# Patient Record
Sex: Female | Born: 1960 | Race: Black or African American | Hispanic: No | Marital: Married | Smoking: Never smoker
Health system: Southern US, Community
[De-identification: ages and names within clinical notes are randomized; demographics above are authoritative.]

## PROBLEM LIST (undated history)

## (undated) DIAGNOSIS — D649 Anemia, unspecified: Secondary | ICD-10-CM

## (undated) DIAGNOSIS — E079 Disorder of thyroid, unspecified: Secondary | ICD-10-CM

## (undated) DIAGNOSIS — I10 Essential (primary) hypertension: Secondary | ICD-10-CM

## (undated) HISTORY — DX: Disorder of thyroid, unspecified: E07.9

## (undated) HISTORY — PX: OTHER SURGICAL HISTORY: SHX169

## (undated) HISTORY — PX: NO PAST SURGERIES: SHX2092

---

## 2011-05-08 ENCOUNTER — Inpatient Hospital Stay (HOSPITAL_COMMUNITY)
Admission: EM | Admit: 2011-05-08 | Discharge: 2011-05-11 | DRG: 395 | Disposition: A | Discharge status: Home / Self Care | Payer: BC Managed Care – PPO | Attending: Internal Medicine | Admitting: Internal Medicine

## 2011-05-08 ENCOUNTER — Encounter: Payer: Self-pay | Admitting: Emergency Medicine

## 2011-05-08 DIAGNOSIS — D649 Anemia, unspecified: Secondary | ICD-10-CM

## 2011-05-08 DIAGNOSIS — D6489 Other specified anemias: Principal | ICD-10-CM | POA: Diagnosis present

## 2011-05-08 DIAGNOSIS — D219 Benign neoplasm of connective and other soft tissue, unspecified: Secondary | ICD-10-CM | POA: Diagnosis present

## 2011-05-08 DIAGNOSIS — R0609 Other forms of dyspnea: Secondary | ICD-10-CM | POA: Diagnosis present

## 2011-05-08 DIAGNOSIS — R059 Cough, unspecified: Secondary | ICD-10-CM

## 2011-05-08 DIAGNOSIS — R05 Cough: Secondary | ICD-10-CM

## 2011-05-08 DIAGNOSIS — R609 Edema, unspecified: Secondary | ICD-10-CM | POA: Diagnosis present

## 2011-05-08 DIAGNOSIS — D259 Leiomyoma of uterus, unspecified: Secondary | ICD-10-CM | POA: Diagnosis present

## 2011-05-08 DIAGNOSIS — N920 Excessive and frequent menstruation with regular cycle: Secondary | ICD-10-CM

## 2011-05-08 DIAGNOSIS — R0989 Other specified symptoms and signs involving the circulatory and respiratory systems: Secondary | ICD-10-CM | POA: Diagnosis present

## 2011-05-08 DIAGNOSIS — E041 Nontoxic single thyroid nodule: Secondary | ICD-10-CM | POA: Diagnosis present

## 2011-05-08 HISTORY — DX: Anemia, unspecified: D64.9

## 2011-05-08 LAB — DIFFERENTIAL
Basophils Absolute: 0.1 10*3/uL (ref 0.0–0.1)
Lymphs Abs: 1.1 10*3/uL (ref 0.7–4.0)
Monocytes Absolute: 0.4 10*3/uL (ref 0.1–1.0)

## 2011-05-08 LAB — CBC
MCH: 14 pg — ABNORMAL LOW (ref 26.0–34.0)
MCV: 58.5 fL — ABNORMAL LOW (ref 78.0–100.0)
Platelets: 262 10*3/uL (ref 150–400)
RDW: 21.6 % — ABNORMAL HIGH (ref 11.5–15.5)
WBC: 4.6 10*3/uL (ref 4.0–10.5)

## 2011-05-08 LAB — COMPREHENSIVE METABOLIC PANEL
AST: 13 U/L (ref 0–37)
Albumin: 3.5 g/dL (ref 3.5–5.2)
Calcium: 8.7 mg/dL (ref 8.4–10.5)
Creatinine, Ser: 0.57 mg/dL (ref 0.50–1.10)
GFR calc non Af Amer: >90 mL/min (ref >90)

## 2011-05-08 LAB — TROPONIN I: Troponin I: <0.3 ng/mL (ref <0.30)

## 2011-05-08 LAB — ABO/RH: ABO/RH(D): O NEG

## 2011-05-08 NOTE — ED Notes (Signed)
ZOX:[id number]<AV> Expected date:05/08/11<BR> Expected time: 6:43 PM<BR> Means of arrival:Car<BR> Comments:<BR> Renwick, Marylouise Stacks

## 2011-05-08 NOTE — ED Notes (Signed)
Pt states [facility] and was advised to come to the ER, according to the lab results pt presented with abnormal lab values, Hemoglobins 3.0, RBC 2.09, Hematocrit 11.2 is low. Pt is told that she will probably receive transfusion. At this time pt denies being dizzy, however states fells bit tired, but further explains " I thought that is normal, because when I have my period I feel that way" pt also states that she has heavy periods. Using 2-3 pads a day heavily soaked.

## 2011-05-08 NOTE — ED Provider Notes (Signed)
History     CSN: [id number] Arrival date & time: 05/08/2011  7:30 PM   First MD Initiated Contact with Patient 05/08/11 2012      Chief Complaint  Patient presents with  . Anemia    Low Hemoglobin    (Consider location/radiation/quality/duration/timing/severity/associated sxs/prior treatment) HPI Comments: Pt was seen at [facility] for cough that she has had for 6 months, did bloodwork there, showing hgb of 3, sent over here.  Says that she does have heavy periods, has been bleeding every 2 weeks for the last couple of months.  Has had some fatigue during period.  Does have some SOB on exertion and some tightness to chest on exertion.  No CP now.  Had normal BM yesterday without blood.  No known hx of anemia  Patient is a 50 y.o. female presenting with anemia. The history is provided by the patient.  Anemia This is a new problem. Episode onset: noted today based on labs. The problem has not changed since onset.Associated symptoms include chest pain and shortness of breath. Pertinent negatives include no abdominal pain and no headaches. The symptoms are aggravated by nothing. The symptoms are relieved by nothing. She has tried nothing for the symptoms.    History reviewed. No pertinent past medical history.  History reviewed. No pertinent past surgical history.  Family History  Problem Relation Age of Onset  . Hypertension Other     History  Substance Use Topics  . Smoking status: Never Smoker   . Smokeless tobacco: Not on file  . Alcohol Use: No    OB History    Grav Para Term Preterm Abortions TAB SAB Ect Mult Living                  Review of Systems  Constitutional: Negative for fever, chills, diaphoresis and fatigue.  HENT: Negative for congestion, rhinorrhea and sneezing.   Eyes: Negative.   Respiratory: Positive for shortness of breath. Negative for cough and chest tightness.   Cardiovascular: Positive for chest pain. Negative for leg swelling.    Gastrointestinal: Negative for nausea, vomiting, abdominal pain, diarrhea and blood in stool.  Genitourinary: Positive for vaginal bleeding. Negative for frequency, hematuria, flank pain and difficulty urinating.  Musculoskeletal: Negative for back pain and arthralgias.  Skin: Negative for rash.  Neurological: Positive for light-headedness. Negative for dizziness, speech difficulty, weakness, numbness and headaches.    Allergies  Review of patient's allergies indicates no known allergies.  Home Medications   Current Outpatient Rx  Name Route Sig Dispense Refill  . ASPIRIN 81 MG PO TABS Oral Take 81 mg by mouth daily.        BP 110/48  Pulse 85  Temp(Src) 98.4 F (36.9 C) (Oral)  Resp 20  SpO2 100%  LMP 04/27/2011  Physical Exam  Constitutional: She is oriented to person, place, and time. She appears well-developed and well-nourished.  HENT:  Head: Normocephalic and atraumatic.  Eyes: Pupils are equal, round, and reactive to light.       Very pale conjunctiva  Neck: Normal range of motion. Neck supple.  Cardiovascular: Normal rate, regular rhythm and normal heart sounds.   Pulmonary/Chest: Effort normal and breath sounds normal. No respiratory distress. She has no wheezes. She has no rales. She exhibits no tenderness.  Abdominal: Soft. Bowel sounds are normal. There is no tenderness. There is no rebound and no guarding.  Musculoskeletal: Normal range of motion. She exhibits no edema.  Lymphadenopathy:    She has  no cervical adenopathy.  Neurological: She is alert and oriented to person, place, and time.  Skin: Skin is warm and dry. No rash noted.  Psychiatric: She has a normal mood and affect.    ED Course  Procedures (including critical care time)  Results for orders placed during the hospital encounter of 05/08/11  CBC      Component Value Range   WBC 4.6  4.0 - 10.5 (K/uL)   RBC 2.07 (*) 3.87 - 5.11 (MIL/uL)   Hemoglobin 3.0 (*) 12.0 - 15.0 (g/dL)   HCT 40.9  (*) 81.1 - 46.0 (%)   MCV 58.5 (*) 78.0 - 100.0 (fL)   MCH 14.0 (*) 26.0 - 34.0 (pg)   MCHC 24.0 (*) 30.0 - 36.0 (g/dL)   RDW 91.4 (*) 78.2 - 15.5 (%)   Platelets 262  150 - 400 (K/uL)  DIFFERENTIAL      Component Value Range   Neutrophils Relative 65  43 - 77 (%)   Lymphocytes Relative 23  12 - 46 (%)   Monocytes Relative 9  3 - 12 (%)   Eosinophils Relative 1  0 - 5 (%)   Basophils Relative 2 (*) 0 - 1 (%)   Neutro Abs 3.0  1.7 - 7.7 (K/uL)   Lymphs Abs 1.1  0.7 - 4.0 (K/uL)   Monocytes Absolute 0.4  0.1 - 1.0 (K/uL)   Eosinophils Absolute 0.0  0.0 - 0.7 (K/uL)   Basophils Absolute 0.1  0.0 - 0.1 (K/uL)   RBC Morphology RARE NRBCs    COMPREHENSIVE METABOLIC PANEL      Component Value Range   Sodium 137  135 - 145 (mEq/L)   Potassium 3.6  3.5 - 5.1 (mEq/L)   Chloride 107  96 - 112 (mEq/L)   CO2 21  19 - 32 (mEq/L)   Glucose, Bld 82  70 - 99 (mg/dL)   BUN 12  6 - 23 (mg/dL)   Creatinine, Ser 9.56  0.50 - 1.10 (mg/dL)   Calcium 8.7  8.4 - 21.3 (mg/dL)   Total Protein 7.2  6.0 - 8.3 (g/dL)   Albumin 3.5  3.5 - 5.2 (g/dL)   AST 13  0 - 37 (U/L)   ALT 7  0 - 35 (U/L)   Alkaline Phosphatase 48  39 - 117 (U/L)   Total Bilirubin 0.4  0.3 - 1.2 (mg/dL)   GFR calc non Af Amer >90  >90 (mL/min)   GFR calc Af Amer >90  >90 (mL/min)  PREPARE RBC (CROSSMATCH)      Component Value Range   Order Confirmation ORDER PROCESSED BY BLOOD BANK    TROPONIN I      Component Value Range   Troponin I <0.30  <0.30 (ng/mL)  TYPE AND SCREEN      Component Value Range   ABO/RH(D) O NEG     Antibody Screen NEG     Sample Expiration 05/11/2011     Unit Number [id number]     Blood Component Type RED CELLS,LR     Unit division 00     Status of Unit ISSUED     Transfusion Status OK TO TRANSFUSE     Crossmatch Result Compatible     Unit Number [id number]     Blood Component Type RED CELLS,LR     Unit division 00     Status of Unit ALLOCATED     Transfusion Status OK TO TRANSFUSE     Crossmatch  Result Compatible  ABO/RH      Component Value Range   ABO/RH(D) O NEG     No results found.  Results for orders placed during the hospital encounter of 05/08/11  CBC      Component Value Range   WBC 4.6  4.0 - 10.5 (K/uL)   RBC 2.07 (*) 3.87 - 5.11 (MIL/uL)   Hemoglobin 3.0 (*) 12.0 - 15.0 (g/dL)   HCT 78.2 (*) 95.6 - 46.0 (%)   MCV 58.5 (*) 78.0 - 100.0 (fL)   MCH 14.0 (*) 26.0 - 34.0 (pg)   MCHC 24.0 (*) 30.0 - 36.0 (g/dL)   RDW 21.3 (*) 08.6 - 15.5 (%)   Platelets 262  150 - 400 (K/uL)  DIFFERENTIAL      Component Value Range   Neutrophils Relative 65  43 - 77 (%)   Lymphocytes Relative 23  12 - 46 (%)   Monocytes Relative 9  3 - 12 (%)   Eosinophils Relative 1  0 - 5 (%)   Basophils Relative 2 (*) 0 - 1 (%)   Neutro Abs 3.0  1.7 - 7.7 (K/uL)   Lymphs Abs 1.1  0.7 - 4.0 (K/uL)   Monocytes Absolute 0.4  0.1 - 1.0 (K/uL)   Eosinophils Absolute 0.0  0.0 - 0.7 (K/uL)   Basophils Absolute 0.1  0.0 - 0.1 (K/uL)   RBC Morphology RARE NRBCs    COMPREHENSIVE METABOLIC PANEL      Component Value Range   Sodium 137  135 - 145 (mEq/L)   Potassium 3.6  3.5 - 5.1 (mEq/L)   Chloride 107  96 - 112 (mEq/L)   CO2 21  19 - 32 (mEq/L)   Glucose, Bld 82  70 - 99 (mg/dL)   BUN 12  6 - 23 (mg/dL)   Creatinine, Ser 5.78  0.50 - 1.10 (mg/dL)   Calcium 8.7  8.4 - 46.9 (mg/dL)   Total Protein 7.2  6.0 - 8.3 (g/dL)   Albumin 3.5  3.5 - 5.2 (g/dL)   AST 13  0 - 37 (U/L)   ALT 7  0 - 35 (U/L)   Alkaline Phosphatase 48  39 - 117 (U/L)   Total Bilirubin 0.4  0.3 - 1.2 (mg/dL)   GFR calc non Af Amer >90  >90 (mL/min)   GFR calc Af Amer >90  >90 (mL/min)  PREPARE RBC (CROSSMATCH)      Component Value Range   Order Confirmation ORDER PROCESSED BY BLOOD BANK    TROPONIN I      Component Value Range   Troponin I <0.30  <0.30 (ng/mL)  TYPE AND SCREEN      Component Value Range   ABO/RH(D) O NEG     Antibody Screen NEG     Sample Expiration 05/11/2011     Unit Number [id number]     Blood  Component Type RED CELLS,LR     Unit division 00     Status of Unit ISSUED     Transfusion Status OK TO TRANSFUSE     Crossmatch Result Compatible     Unit Number [id number]     Blood Component Type RED CELLS,LR     Unit division 00     Status of Unit ALLOCATED     Transfusion Status OK TO TRANSFUSE     Crossmatch Result Compatible    ABO/RH      Component Value Range   ABO/RH(D) O NEG     No results found.   Date:  05/08/2011  Rate: 71  Rhythm: normal sinus rhythm  QRS Axis: normal  Intervals: normal  ST/T Wave abnormalities: normal  Conduction Disutrbances:none  Narrative Interpretation:   Old EKG Reviewed: none available    1. Anemia       MDM  Pt with anemia, likely from heavy vag bleeding.  Will start transfusion.  Consult for admission        Rolan Bucco, MD 05/09/11 0004

## 2011-05-09 ENCOUNTER — Inpatient Hospital Stay (HOSPITAL_COMMUNITY): Payer: BC Managed Care – PPO

## 2011-05-09 ENCOUNTER — Emergency Department (HOSPITAL_COMMUNITY): Payer: BC Managed Care – PPO

## 2011-05-09 ENCOUNTER — Encounter (HOSPITAL_COMMUNITY): Payer: Self-pay | Admitting: *Deleted

## 2011-05-09 DIAGNOSIS — R05 Cough: Secondary | ICD-10-CM

## 2011-05-09 DIAGNOSIS — D649 Anemia, unspecified: Secondary | ICD-10-CM

## 2011-05-09 DIAGNOSIS — N920 Excessive and frequent menstruation with regular cycle: Secondary | ICD-10-CM

## 2011-05-09 LAB — BASIC METABOLIC PANEL
BUN: 9 mg/dL (ref 6–23)
CO2: 22 mEq/L (ref 19–32)
Chloride: 108 mEq/L (ref 96–112)
Creatinine, Ser: 0.55 mg/dL (ref 0.50–1.10)
Potassium: 3.6 mEq/L (ref 3.5–5.1)

## 2011-05-09 LAB — CARDIAC PANEL(CRET KIN+CKTOT+MB+TROPI)
CK, MB: 1.4 ng/mL (ref 0.3–4.0)
CK, MB: 1.4 ng/mL (ref 0.3–4.0)
Relative Index: INVALID (ref 0.0–2.5)
Troponin I: <0.3 ng/mL (ref <0.30)
Troponin I: <0.3 ng/mL (ref <0.30)

## 2011-05-09 LAB — CBC
HCT: 18.5 % — ABNORMAL LOW (ref 36.0–46.0)
Hemoglobin: 5.4 g/dL — CL (ref 12.0–15.0)
MCH: 19.3 pg — ABNORMAL LOW (ref 26.0–34.0)
MCHC: 29.2 g/dL — ABNORMAL LOW (ref 30.0–36.0)
MCV: 66.1 fL — ABNORMAL LOW (ref 78.0–100.0)
Platelets: 245 10*3/uL (ref 150–400)
RBC: 2.8 MIL/uL — ABNORMAL LOW (ref 3.87–5.11)
RDW: 26.4 % — ABNORMAL HIGH (ref 11.5–15.5)
WBC: 4.3 10*3/uL (ref 4.0–10.5)

## 2011-05-09 MED ORDER — ONDANSETRON HCL 4 MG/2ML IJ SOLN: 4.0000 mg | Freq: Four times a day (QID) | INTRAMUSCULAR | Status: DC | PRN

## 2011-05-09 MED ORDER — SODIUM CHLORIDE 0.9 % IJ SOLN
3.0000 mL | Freq: Two times a day (BID) | INTRAMUSCULAR | Status: DC
  Administered 2011-05-09 – 2011-05-11 (×4): 3 mL via INTRAVENOUS

## 2011-05-09 MED ORDER — ACETAMINOPHEN 325 MG PO TABS
650.0000 mg | ORAL_TABLET | Freq: Four times a day (QID) | ORAL | Status: DC | PRN
  Administered 2011-05-10 (×3): 650 mg via ORAL
  Filled 2011-05-09 (×3): qty 2

## 2011-05-09 MED ORDER — SODIUM CHLORIDE 0.9 % IV SOLN
INTRAVENOUS | Status: AC
  Administered 2011-05-09: 06:00:00 via INTRAVENOUS

## 2011-05-09 MED ORDER — ONDANSETRON HCL 4 MG PO TABS: 4.0000 mg | ORAL_TABLET | Freq: Four times a day (QID) | ORAL | Status: DC | PRN

## 2011-05-09 MED ORDER — SODIUM CHLORIDE 0.9 % IJ SOLN: 3.0000 mL | INTRAMUSCULAR | Status: DC | PRN

## 2011-05-09 MED ORDER — SODIUM CHLORIDE 0.9 % IV SOLN: 250.0000 mL | INTRAVENOUS | Status: DC | PRN

## 2011-05-09 MED ORDER — ACETAMINOPHEN 650 MG RE SUPP: 650.0000 mg | Freq: Four times a day (QID) | RECTAL | Status: DC | PRN

## 2011-05-09 NOTE — H&P (Signed)
Primary Care Physician: None  Chief Complaint: New-onset anemia  History of Present Illness: The patient is an otherwise healthy 50 year old African American woman who presented to urgent care for evaluation of cough and was found to have a hemoglobin of 3.0. The patient reports that she started to notice a dry cough approximately 6 months ago. Reports worse with lying flat, although she will also experience throughout the day. No fevers, chills, night sweats, or weight loss. Does report secondhand smoke from her husband, as well as possible occupational inhalants as she works in a [employer]. No history of allergic rhinitis. Has never been evaluated for this before.   When she went to urgent care, her hemoglobin was found to be 3.0. Patient does report that she normally has a monthly cycle with 7 days of moderate to heavy bleeding, using about 2-3 pads a day. She reports that her periods were always like this until recently when over the past 3 months she has been having inter-cycle bleeding, currently having periods approximately every 2 weeks for 7 days at a time. Denies any chest pain or palpitations, although she has been experiencing some new dyspnea on exertion. Last pelvic examination was > 20 years ago. Has been taking aspirin as needed to help with her cough. Also reports some new bilateral lower extremity edema at her ankles. No history of thyroid problems in her family. No new lethargy, thinning of hair, brittle nails, or heat/cold intolerance.  In the emergency room, temperature 90.2, blood pressure 128/61, heart rate 86, respirations 16, satting 100% on room air. Labs were was significant for hemoglobin of 3.0. Patient was given 2 units of PRBCs and admitted for further evaluation and treatment.  Past medical history: None  Past surgical history: None  Allergies: No known drug allergies  Medications: Aspirin as needed  Family history:  No history of hypothyroidism,  fibroids, menorrhagia, or malignancies.  Social history: Married, lives with her husband Works at [employer] Denies any tobacco, ethanol, or illicits  Review of Systems: General: As per history of present illness Skin: No rashes or lacerations HEENT: No rhinorrhea, sore throat, dry mouth, hearing difficulties Pulmonary: As per history of present illness Cardivascular: No chest pain, dyspnea on exertion, palpitations, lightheaded/dizziness, paroxysmal nocturnal dyspnea, orthopnea Gastrointestinal: No abdominal pain, dysphagia, odynophagia, nausea, vomiting, hematemesis, melena, hematochezia, bowel changes Genitourinary: No dysuria, hematuria, increased urinary frequency/urgency. No discharge Musculoskeletal: No muscle aches, pain. No arthritis Hematologic: No easy bruising or bleeding Neurologic: No headaches, vision changes, focal neurologic deficits Psychologic: No suicidial or homicidal ideation. No depression  Filed Vitals:   05/08/11 2315 05/08/11 2330 05/08/11 2345 05/09/11 0000  BP: 120/67 110/60 117/54 110/48  Pulse:      Temp:      TempSrc:      Resp: 14 22 19 20   SpO2:       Physical Exam: General: Alert and oriented x 3, no apparent distress Skin: No rashes, bruises HEENT: Head atraumatic, sclera anicertic, + conjunctival pallor, pupils equal and reactive to light, oropharynx moist with tonsils unremarkable Neck: Soft, no lymphadenopathy, thyromegaly, or bruits Chest: Clear to auscultation bilaterally, no wheezes, rales, or ronchi Heart: Regular rate and rhythm, grade III/VI systolic ejection murmur Abdomen: Soft, nontender, nondistended, + bowel sounds, no masses Extremities: No cyanosis or clubbing, with 1+ bilateral lower extremity pitting edema up to the ankles. 2+ radial and dorsalis pedis pulses bilaterally Neurologic: Grossly intact  Labs: CBC    Component Value Date/Time   WBC  4.6 05/08/2011 2055   RBC 2.07* 05/08/2011 2055   HGB 3.0* 05/08/2011  2055   HCT 12.1* 05/08/2011 2055   PLT 262 05/08/2011 2055   MCV 58.5* 05/08/2011 2055   MCH 14.0* 05/08/2011 2055   MCHC 24.0* 05/08/2011 2055   RDW 21.6* 05/08/2011 2055   LYMPHSABS 1.1 05/08/2011 2055   MONOABS 0.4 05/08/2011 2055   EOSABS 0.0 05/08/2011 2055   BASOSABS 0.1 05/08/2011 2055    BMET    Component Value Date/Time   NA 137 05/08/2011 2055   K 3.6 05/08/2011 2055   CL 107 05/08/2011 2055   CO2 21 05/08/2011 2055   GLUCOSE 82 05/08/2011 2055   BUN 12 05/08/2011 2055   CREATININE 0.57 05/08/2011 2055   CALCIUM 8.7 05/08/2011 2055   GFRNONAA >90 05/08/2011 2055   GFRAA >90 05/08/2011 2055    LFTs: AST 13 ALT 7 Alkaline phosphatase 48 Total bilirubin 0.4 Total protein 7.2 Albumin 3.5  Impression/Plan: Otherwise healthy 50 year old African American woman who presented to urgent care for evaluation of cough x 6 months and was found to have a hemoglobin of 3.0, hemodynamically stable.  Anemia: Slightly symptomatic, reporting dyspnea on exertion but otherwise stable currently receiving 2 units of PRBCs.  Most likely related to menorrhagia although given cough, malignancy is not entirely excluded. In addition, hypothyroidism is also a consideration although less likely. - Admit to telemetry - Transfuse to hemoglobin 7.0 - Obtain TSH, chest x-ray and transvaginal ultrasound for further evaluation - Continue to monitor closely  Chronic cough: Unclear etiology although differential considerations include GERD, allergic rhinitis, atypical mycobacterial infection, hypersensitivity pneumonitis from possible occupational exposure, respiratory bronchiolitis given secondary smoking, and malignancy. - Followup chest x-ray as above - Symptomatic management for now  Bilateral lower extremity edema: May be related to anemia although differential considerations also include hypothyroidism and venous insufficiency. - TSH as above - Continue to monitor  Health  maintenance: - Patient encouraged to see a PCP for annual exam, pap, and breast screening amongst other health maintenance.  Fluid/electrolytes/nutrition: - Hep-locked IV - Daily electrolytes given multiple transfusions - General diet  Prophylaxis: - Ambulation. Holding pharmacologic anticoagulation given anemia.  CODE STATUS: Full code

## 2011-05-09 NOTE — Progress Notes (Signed)
Subjective: "I have less shortness of breath since i got the blood" Denies pain/discomfort  Objective: Vital signs Filed Vitals:   05/09/11 0206 05/09/11 0306 05/09/11 0402 05/09/11 0636  BP: 121/68 105/61 119/70 134/77  Pulse: 70 74 75 66  Temp: 98.9 F (37.2 C) 98.8 F (37.1 C) 98.8 F (37.1 C) 98.5 F (36.9 C)  TempSrc: Oral Oral Oral Oral  Resp: 18 18 18 20   Height:      Weight:      SpO2:    100%   Weight change:  Last BM Date: 05/07/11  Intake/Output from previous day: 11/29 0701 - 11/30 0700 In: 1260 [I.V.:560; Blood:700] Out: -      Physical Exam: General: Alert, awake, oriented x3, in no acute distress. HEENT: No bruits, no goiter. Mucus membranes moinst/pink Heart: Regular rate and rhythm, rubs, gallops. +murmur. Lungs:Normal effort.  Clear to auscultation bilaterally. No wheeze, rhonchi Abdomen: Soft, nontender, nondistended, positive bowel sounds. No mass organmegaly noted Extremities: No clubbing cyanosis with positive pedal pulses. Trace-1+ LE edema Neuro: Grossly intact, nonfocal. Cranial nerve II-XII intact    Lab Results: Basic Metabolic Panel:  Basename 05/09/11 0530 05/08/11 2055  NA 137 137  K 3.6 3.6  CL 108 107  CO2 22 21  GLUCOSE 86 82  BUN 9 12  CREATININE 0.55 0.57  CALCIUM 8.6 8.7  MG -- --  PHOS -- --   Liver Function Tests:  [facility] 05/08/11 2055  AST 13  ALT 7  ALKPHOS 48  BILITOT 0.4  PROT 7.2  ALBUMIN 3.5   No results found for this basename: LIPASE:2,AMYLASE:2 in the last 72 hours No results found for this basename: AMMONIA:2 in the last 72 hours CBC:  Basename 05/09/11 0530 05/08/11 2055  WBC 4.3 4.6  NEUTROABS -- 3.0  HGB 5.4* 3.0*  HCT 18.5* 12.1*  MCV 66.1* 58.5*  PLT 245 262   Cardiac Enzymes:  Basename 05/08/11 2055  CKTOTAL --  CKMB --  CKMBINDEX --  TROPONINI <0.30   BNP: No results found for this basename: POCBNP:3 in the last 72 hours D-Dimer: No results found for this basename:  DDIMER:2 in the last 72 hours CBG: No results found for this basename: GLUCAP:6 in the last 72 hours Hemoglobin A1C: No results found for this basename: HGBA1C in the last 72 hours Fasting Lipid Panel: No results found for this basename: CHOL,HDL,LDLCALC,TRIG,CHOLHDL,LDLDIRECT in the last 72 hours Thyroid Function Tests: No results found for this basename: TSH,T4TOTAL,FREET4,T3FREE,THYROIDAB in the last 72 hours Anemia Panel: No results found for this basename: VITAMINB12,FOLATE,FERRITIN,TIBC,IRON,RETICCTPCT in the last 72 hours Coagulation: No results found for this basename: LABPROT:2,INR:2 in the last 72 hours Urine Drug Screen: Drugs of Abuse  No results found for this basename: labopia, cocainscrnur, labbenz, amphetmu, thcu, labbarb    Alcohol Level: No results found for this basename: ETH:2 in the last 72 hours Urinalysis:  Misc. Labs:  No results found for this or any previous visit (from the past 240 hour(s)).  Studies/Results: Dg Chest 2 View  05/09/2011  *RADIOLOGY REPORT*  Clinical Data: Cough and chest pain.  CHEST - 2 VIEW  Comparison: None.  Findings: The lungs are well-aerated.  Mild vascular congestion is noted.  There is no evidence of focal opacification, pleural effusion or pneumothorax.  The heart is borderline normal in size; the mediastinal contour is within normal limits.  No acute osseous abnormalities are seen.  IMPRESSION: Mild vascular congestion noted; lungs remain clear.  Original Report Authenticated By: JEFFREY  Cherly Hensen, M.D.    Medications: Scheduled Meds:   . sodium chloride   Intravenous STAT  . sodium chloride  3 mL Intravenous Q12H   Continuous Infusions:  PRN Meds:.sodium chloride, acetaminophen, acetaminophen, ondansetron (ZOFRAN) IV, ondansetron, sodium chloride  Assessment/Plan:  Principal Problem: 1. *Anemia: likely ABL secondary to menorrhagia. Pt reports heavy blding for 7 day cycles that have been occurring every 10 days since early  October. S/P 2 units. Hg 5.4 from 3.0. Pt. Reports improved SOB. Will obtain pelvic US. TSH and anemia panel pending. Will also check FOBT. If + consider GI consult. Will continue tele and monitor Hg closely Active Problems:  2.Cough: etiology unclear. Chest xray as above. Cough is non productive.  3. Menorrhagia: pelvic US and transvaginal US pending 4. Bilateral lower extremity edema. Possibly related to #1. Chest xray shows some mild vascular congestion and borderline heart size. TSH pending. Once #1 resolved and TSH results known may consider 2decho. Will monitor I &O 5. Compliance. Will request CM consult to arrange PCP for OP follow up  LOS: 1 day   [facility] M 05/09/2011, 8:44 AM

## 2011-05-09 NOTE — ED Notes (Signed)
Called the floor for report, per secretary RN in the room with pt, will call back in 5 min.

## 2011-05-09 NOTE — Progress Notes (Signed)
Patient's hemoglobin is 5.4 this am after receiving two units of packed red blood cells last night.  Dr. Susie Cassette notified.  Vital signs are stable.  Will continue to monitor patient.

## 2011-05-09 NOTE — Progress Notes (Signed)
Patient seen examined, and even transfusing 2 units of packed red blood cells, which will give her a total of 4 units. I have personally spoken to her and reviewed management with Pricilla Larsson nurse practitioner.

## 2011-05-09 NOTE — ED Notes (Signed)
Patient transported to X-ray and back to the room.

## 2011-05-09 NOTE — ED Notes (Signed)
Report given to the 4th floor RN.

## 2011-05-09 NOTE — Progress Notes (Signed)
UR CHART REVIEWED 

## 2011-05-10 ENCOUNTER — Inpatient Hospital Stay (HOSPITAL_COMMUNITY): Payer: BC Managed Care – PPO

## 2011-05-10 DIAGNOSIS — E041 Nontoxic single thyroid nodule: Secondary | ICD-10-CM | POA: Diagnosis present

## 2011-05-10 DIAGNOSIS — D219 Benign neoplasm of connective and other soft tissue, unspecified: Secondary | ICD-10-CM | POA: Diagnosis present

## 2011-05-10 LAB — CBC
HCT: 25.1 % — ABNORMAL LOW (ref 36.0–46.0)
Hemoglobin: 7.9 g/dL — ABNORMAL LOW (ref 12.0–15.0)
MCV: 70.7 fL — ABNORMAL LOW (ref 78.0–100.0)
RBC: 3.55 MIL/uL — ABNORMAL LOW (ref 3.87–5.11)
WBC: 6.1 10*3/uL (ref 4.0–10.5)

## 2011-05-10 LAB — BASIC METABOLIC PANEL
CO2: 20 mEq/L (ref 19–32)
Chloride: 111 mEq/L (ref 96–112)
GFR calc Af Amer: >90 mL/min (ref >90)
Potassium: 3.7 mEq/L (ref 3.5–5.1)
Sodium: 137 mEq/L (ref 135–145)

## 2011-05-10 LAB — OCCULT BLOOD X 1 CARD TO LAB, STOOL: Fecal Occult Bld: NEGATIVE

## 2011-05-10 LAB — CARDIAC PANEL(CRET KIN+CKTOT+MB+TROPI)
CK, MB: 1.4 ng/mL (ref 0.3–4.0)
Relative Index: INVALID (ref 0.0–2.5)
Total CK: 31 U/L (ref 7–177)

## 2011-05-10 LAB — PROTIME-INR: INR: 1.1 (ref 0.00–1.49)

## 2011-05-10 MED ORDER — IOHEXOL 300 MG/ML  SOLN
100.0000 mL | Freq: Once | INTRAMUSCULAR | Status: AC | PRN
  Administered 2011-05-10: 100 mL via INTRAVENOUS

## 2011-05-10 MED ORDER — FERROUS GLUCONATE IRON 246 (28 FE) MG PO TABS: 246.0000 mg | ORAL_TABLET | Freq: Every day | ORAL | Status: DC

## 2011-05-10 NOTE — Progress Notes (Signed)
Subjective: Patient feels much better today. The shortness of breath has improved.  Objective: Weight change:  No intake or output data in the 24 hours ending 05/10/11 1618 General: Patient appears her stated age. HEENT: Head normocephalic. Cardiovascular: Regular rate rhythm. Lungs: Clear bilaterally. Abdomen: Soft nontender positive bowel sounds. Extremities: No edema  Lab Results: Reviewed  Micro Results: No results found for this or any previous visit (from the past 240 hour(s)).  Studies/Results: Dg Chest 2 View  05/09/2011  *RADIOLOGY REPORT*  Clinical Data: Cough and chest pain.  CHEST - 2 VIEW  Comparison: None.  Findings: The lungs are well-aerated.  Mild vascular congestion is noted.  There is no evidence of focal opacification, pleural effusion or pneumothorax.  The heart is borderline normal in size; the mediastinal contour is within normal limits.  No acute osseous abnormalities are seen.  IMPRESSION: Mild vascular congestion noted; lungs remain clear.  Original Report Authenticated By: Tonia Ghent, M.D.   Ct Angio Chest W/cm &/or Wo Cm  05/10/2011  *RADIOLOGY REPORT*  Clinical Data:  Shortness of breath, chest tightness, elevated D- dimer  CT ANGIOGRAPHY CHEST WITH CONTRAST  Technique:  Multidetector CT imaging of the chest was performed using the standard protocol during bolus administration of intravenous contrast.  Multiplanar CT image reconstructions including MIPs were obtained to evaluate the vascular anatomy.  Contrast: OMNIPAQUE IOHEXOL 300 MG/ML IV SOLN  Comparison:  None  Findings:  There is cardiomegaly, mild pulmonary edema and small bilateral pleural effusions, right greater than left.  A 4 cm mass is present within the right lobe of thyroid gland. A small amount of pericardial fluid is present.  There is also nonspecific soft tissue fullness in the subcarinal region which may represent reactive adenopathy.  No axillary or hilar adenopathy is identified.   The thoracic aorta has a normal caliber and appearance.  There are no filling defects within either pulmonary arterial system to suggest a segmental or subsegmental pulmonary embolus.  The osseous structures are unremarkable.  The visualized upper abdomen has a normal appearance.  Review of the MIP images confirms the above findings.  IMPRESSION: There is no evidence of pulmonary embolus.  There is mild pulmonary edema and small bilateral pleural effusions, right greater than left.  There is a 4 cm mass within the right lobe of the thyroid gland. Better characterization with nuclear medicine imaging and / or ultrasound may be of help  if tissue sampling is not obtained.  Original Report Authenticated By: Brandon Melnick, M.D.   US Transvaginal Non-ob  05/09/2011  *RADIOLOGY REPORT*  Clinical Data: Dysfunctional uterine bleeding.  Anemia.  TRANSABDOMINAL AND TRANSVAGINAL ULTRASOUND OF PELVIS  Technique:  Both transabdominal and transvaginal ultrasound examinations of the pelvis were performed.  Transabdominal technique was performed for global imaging of the pelvis including uterus, ovaries, adnexal regions, and pelvic cul-de-sac.  It was necessary to proceed with endovaginal exam following the transabdominal exam to visualize the endometrium and ovaries.  Comparison:  None.  Findings: Uterus:  15.8 x 9.2 x 10.7 cm.  Numerous fibroids are seen involving the uterus diffusely.  These are too numerous to count. There are at least four larger fibroids which measure between 4.4 cm and 5.3 cm in diameter.  Multiple other smaller fibroids are identified.  Endometrium: Not well visualized by either transabdominal or transvaginal sonography due to acoustic shadowing from multiple fibroids.  Right ovary: 4.0 x 2.3 x 3.3 cm.  Normal appearance.  Left ovary: 2.2 x 1.5  x 1.6 cm.  Normal appearance.  Other Findings:  Trace free fluid seen in cul-de-sac.  IMPRESSION:  1.  Moderately enlarged uterus with numerous fibroids,  largest measuring 4-5 cm. 2.  Normal ovaries.  No adnexal mass identified.  Original Report Authenticated By: Danae Orleans, M.D.   US Pelvis Complete  05/09/2011  *RADIOLOGY REPORT*  Clinical Data: Dysfunctional uterine bleeding.  Anemia.  TRANSABDOMINAL AND TRANSVAGINAL ULTRASOUND OF PELVIS  Technique:  Both transabdominal and transvaginal ultrasound examinations of the pelvis were performed.  Transabdominal technique was performed for global imaging of the pelvis including uterus, ovaries, adnexal regions, and pelvic cul-de-sac.  It was necessary to proceed with endovaginal exam following the transabdominal exam to visualize the endometrium and ovaries.  Comparison:  None.  Findings: Uterus:  15.8 x 9.2 x 10.7 cm.  Numerous fibroids are seen involving the uterus diffusely.  These are too numerous to count. There are at least four larger fibroids which measure between 4.4 cm and 5.3 cm in diameter.  Multiple other smaller fibroids are identified.  Endometrium: Not well visualized by either transabdominal or transvaginal sonography due to acoustic shadowing from multiple fibroids.  Right ovary: 4.0 x 2.3 x 3.3 cm.  Normal appearance.  Left ovary: 2.2 x 1.5 x 1.6 cm.  Normal appearance.  Other Findings:  Trace free fluid seen in cul-de-sac.  IMPRESSION:  1.  Moderately enlarged uterus with numerous fibroids, largest measuring 4-5 cm. 2.  Normal ovaries.  No adnexal mass identified.  Original Report Authenticated By: Danae Orleans, M.D.   Medications: Scheduled Meds:   . sodium chloride   Intravenous STAT  . sodium chloride  3 mL Intravenous Q12H   Continuous Infusions:  PRN Meds:.sodium chloride, acetaminophen, acetaminophen, iohexol, ondansetron (ZOFRAN) IV, ondansetron, sodium chloride  Assessment/Plan: Anemia (05/09/2011)   Her anemia is secondary to multiple fibroids. She states that she does have menorrhagia. She will schedule her self a followup appointment with GYN. I discussed with her and  her husband the essentials of following up within the next one to 2 weeks.  Cough (05/09/2011)  Her chest x-ray shows pleural effusion. I suspect this is related to her severe anemia she probably has some heart failure secondary to the severe anemia. Will get 2-D echo.  Menorrhagia (05/09/2011)/fibroids :  referred patient to OB/GYN.  Thyroid nodule:  Patient to followup outpatient for further evaluation. She has an appointment with her primary care physician on January 4. Mentioned to her to follow up with her primary care regarding the thyroid nodule for further workup.   LOS: 2 days   Earlene Plater MD, [facility] 05/10/2011, 4:18 PM

## 2011-05-11 DIAGNOSIS — I059 Rheumatic mitral valve disease, unspecified: Secondary | ICD-10-CM

## 2011-05-11 LAB — OCCULT BLOOD X 1 CARD TO LAB, STOOL: Fecal Occult Bld: NEGATIVE

## 2011-05-11 LAB — BASIC METABOLIC PANEL
Chloride: 108 mEq/L (ref 96–112)
Creatinine, Ser: 0.62 mg/dL (ref 0.50–1.10)
GFR calc Af Amer: >90 mL/min (ref >90)
GFR calc non Af Amer: >90 mL/min (ref >90)
Potassium: 3.7 mEq/L (ref 3.5–5.1)

## 2011-05-11 LAB — CBC
HCT: 26.2 % — ABNORMAL LOW (ref 36.0–46.0)
Hemoglobin: 8.1 g/dL — ABNORMAL LOW (ref 12.0–15.0)
MCHC: 30.9 g/dL (ref 30.0–36.0)
RDW: 27 % — ABNORMAL HIGH (ref 11.5–15.5)
WBC: 5.7 10*3/uL (ref 4.0–10.5)

## 2011-05-11 LAB — TSH: TSH: 1.232 u[IU]/mL (ref 0.350–4.500)

## 2011-05-11 NOTE — Progress Notes (Signed)
*  PRELIMINARY RESULTS* Echocardiogram 2D Echocardiogram has been performed.  Al Corpus 05/11/2011, 12:09 PM

## 2011-05-11 NOTE — Discharge Summary (Signed)
. Physician Discharge Summary  Patient ID: Joyce Brooks MRN: [id number] DOB/AGE: May 14, 1961 50 y.o.  Admit date: 05/08/2011 Discharge date: 05/11/2011  Discharge Diagnoses:  Anemia Cough Menorrhagia Fibroids Thyroid nodule   Current Discharge Medication List    START taking these medications   Details  ferrous gluconate (FERGON) 246 (28 FE) MG tablet Take 1 tablet (246 mg total) by mouth daily with breakfast. Qty: 60 tablet, Refills: 0      STOP taking these medications     aspirin 325 MG tablet         Discharge Orders    Future Appointments: Provider: Department: Dept Phone: Center:   06/13/2011 2:45 PM Etta Grandchild, MD [facility] [phone] [facility]     Future Orders Please Complete By Expires   Diet general      Increase activity slowly      Discharge instructions      Comments:   Please follow up with Gynecology as soon as possible      Follow-up Information    Follow up with Sanda Linger, MD on 06/13/2011. (at 2:45PM)    Contact information:   [address] [facility] [address] [place], [address] [place] [address] [phone]       Follow up with [facility]. (call for APT)          Disposition: home  Discharged Condition: Stable  Full Code   Consults:    HPI:  The patient is an otherwise healthy 50 year old African American woman who presented to urgent care for evaluation of cough and was found to have a hemoglobin of 3.0. The patient reports that she started to notice a dry cough approximately 6 months ago. Reports worse with lying flat, although she will also experience throughout the day. No fevers, chills, night sweats, or weight loss. Does report secondhand smoke from her husband, as well as possible occupational inhalants as she works in a [employer]. No history of allergic rhinitis. Has never been evaluated for this before.  When she went to urgent care, her hemoglobin was found to be 3.0. Patient does report that she  normally has a monthly cycle with 7 days of moderate to heavy bleeding, using about 2-3 pads a day. She reports that her periods were always like this until recently when over the past 3 months she has been having inter-cycle bleeding, currently having periods approximately every 2 weeks for 7 days at a time. Denies any chest pain or palpitations, although she has been experiencing some new dyspnea on exertion. Last pelvic examination was > 20 years ago. Has been taking aspirin as needed to help with her cough. Also reports some new bilateral lower extremity edema at her ankles. No history of thyroid problems in her family. No new lethargy, thinning of hair, brittle nails, or heat/cold intolerance.  In the emergency room, temperature 90.2, blood pressure 128/61, heart rate 86, respirations 16, satting 100% on room air. Labs were was significant for hemoglobin of 3.0. Patient was given 2 units of PRBCs and admitted for further evaluation and treatment.   PMH:  As per H & P FH: As per  H & P SH: As per H & P Med: as per H & P Allergies and ROS: as per H&P   Discharge Exam: Blood pressure 131/77, pulse 63, temperature 98.5 F (36.9 C), temperature source Oral, resp. rate 18, height 5\' 4"  (1.626 m), weight 64.7 kg (142 lb 10.2 oz), last menstrual period 04/27/2011, SpO2 97.00%. General  appearance: alert, cooperative and appears stated age Head: Normocephalic, without obvious abnormality, atraumatic Resp: clear to auscultation bilaterally Cardio: regular rate and rhythm, S1, S2 normal, no murmur, click, rub or gallop GI: soft, non-tender; bowel sounds normal; no masses,  no organomegaly Extremities: extremities normal, atraumatic, no cyanosis or edema  Hospital Course:  Anemia Patient presented to the emergency room with a hemoglobin of 3. She complains of heavy menstrual flow and frequent menstrual cycles. She stated that she went to the urgent care because she was short of breath with swelling in  her legs and was given hydrochlorothiazide and a prednisone taper. She had ultrasound of the abdomen and pelvis that shows numerous fibroids in the uterus. Patient was told to follow up with OB/GYN she will call tomorrow and schedule an appointment. Patient was told to do this urgently because if she continues to have frequent bleeding episodes her hemoglobin will fall again. Her stool guaiac was negative.  Cough Patient also complained of a cough that has since improved. Her chest x-ray does show small bilateral pleural effusion and that could be related to the cough and the pleural effusion is probably secondary to the severe anemia. Her echocardiogram did not show any congestive heart failure.  Menorrhagia/Fibroids As mentioned above patient does have menorrhagia secondary to fibroid she will follow up with OB/GYN.  Thyroid nodule Patient had a thyroid nodule seen on CT scan of the chest. Her TSH is normal. She will also follow up with PCP for further workup of her thyroid nodule.  Labs:   Results for orders placed during the hospital encounter of 05/08/11 (from the past 48 hour(s))  CARDIAC PANEL(CRET KIN+CKTOT+MB+TROPI)     Status: Normal   Collection Time   05/09/11  5:35 PM      Component Value Range Comment   Total CK 34  7 - 177 (U/L)    CK, MB 1.4  0.3 - 4.0 (ng/mL)    Troponin I <0.30  <0.30 (ng/mL)    Relative Index RELATIVE INDEX IS INVALID  0.0 - 2.5    CARDIAC PANEL(CRET KIN+CKTOT+MB+TROPI)     Status: Normal   Collection Time   05/10/11 12:30 AM      Component Value Range Comment   Total CK 31  7 - 177 (U/L)    CK, MB 1.4  0.3 - 4.0 (ng/mL)    Troponin I <0.30  <0.30 (ng/mL)    Relative Index RELATIVE INDEX IS INVALID  0.0 - 2.5    BASIC METABOLIC PANEL     Status: Normal   Collection Time   05/10/11  5:36 AM      Component Value Range Comment   Sodium 137  135 - 145 (mEq/L)    Potassium 3.7  3.5 - 5.1 (mEq/L)    Chloride 111  96 - 112 (mEq/L)    CO2 20  19 - 32  (mEq/L)    Glucose, Bld 94  70 - 99 (mg/dL)    BUN 10  6 - 23 (mg/dL)    Creatinine, Ser 1.61  0.50 - 1.10 (mg/dL)    Calcium 8.5  8.4 - 10.5 (mg/dL)    GFR calc non Af Amer >90  >90 (mL/min)    GFR calc Af Amer >90  >90 (mL/min)   CBC     Status: Abnormal   Collection Time   05/10/11  5:36 AM      Component Value Range Comment   WBC 6.1  4.0 - 10.5 (K/uL)  RBC 3.55 (*) 3.87 - 5.11 (MIL/uL)    Hemoglobin 7.9 (*) 12.0 - 15.0 (g/dL)    HCT 16.1 (*) 09.6 - 46.0 (%)    MCV 70.7 (*) 78.0 - 100.0 (fL)    MCH 22.3 (*) 26.0 - 34.0 (pg)    MCHC 31.5  30.0 - 36.0 (g/dL)    RDW 04.5 (*) 40.9 - 15.5 (%)    Platelets 261  150 - 400 (K/uL)   PROTIME-INR     Status: Normal   Collection Time   05/10/11  5:36 AM      Component Value Range Comment   Prothrombin Time 14.4  11.6 - 15.2 (seconds)    INR 1.10  0.00 - 1.49    OCCULT BLOOD X 1 CARD TO LAB, STOOL     Status: Normal   Collection Time   05/10/11  8:36 AM      Component Value Range Comment   Fecal Occult Bld NEGATIVE     TSH     Status: Normal   Collection Time   05/11/11  6:11 AM      Component Value Range Comment   TSH 1.232  0.350 - 4.500 (uIU/mL)   BASIC METABOLIC PANEL     Status: Normal   Collection Time   05/11/11  6:11 AM      Component Value Range Comment   Sodium 137  135 - 145 (mEq/L)    Potassium 3.7  3.5 - 5.1 (mEq/L)    Chloride 108  96 - 112 (mEq/L)    CO2 22  19 - 32 (mEq/L)    Glucose, Bld 85  70 - 99 (mg/dL)    BUN 13  6 - 23 (mg/dL)    Creatinine, Ser 8.11  0.50 - 1.10 (mg/dL)    Calcium 8.6  8.4 - 10.5 (mg/dL)    GFR calc non Af Amer >90  >90 (mL/min)    GFR calc Af Amer >90  >90 (mL/min)   CBC     Status: Abnormal   Collection Time   05/11/11  6:11 AM      Component Value Range Comment   WBC 5.7  4.0 - 10.5 (K/uL)    RBC 3.68 (*) 3.87 - 5.11 (MIL/uL)    Hemoglobin 8.1 (*) 12.0 - 15.0 (g/dL)    HCT 91.4 (*) 78.2 - 46.0 (%)    MCV 71.2 (*) 78.0 - 100.0 (fL)    MCH 22.0 (*) 26.0 - 34.0 (pg)    MCHC 30.9   30.0 - 36.0 (g/dL)    RDW 95.6 (*) 21.3 - 15.5 (%)    Platelets 254  150 - 400 (K/uL)     Diagnostics:  Dg Chest 2 View  05/09/2011  *RADIOLOGY REPORT*  Clinical Data: Cough and chest pain.  CHEST - 2 VIEW  Comparison: None.  Findings: The lungs are well-aerated.  Mild vascular congestion is noted.  There is no evidence of focal opacification, pleural effusion or pneumothorax.  The heart is borderline normal in size; the mediastinal contour is within normal limits.  No acute osseous abnormalities are seen.  IMPRESSION: Mild vascular congestion noted; lungs remain clear.  Original Report Authenticated By: Tonia Ghent, M.D.   Ct Angio Chest W/cm &/or Wo Cm  05/10/2011  *RADIOLOGY REPORT*  Clinical Data:  Shortness of breath, chest tightness, elevated D- dimer  CT ANGIOGRAPHY CHEST WITH CONTRAST  Technique:  Multidetector CT imaging of the chest was performed using the standard protocol during bolus administration  of intravenous contrast.  Multiplanar CT image reconstructions including MIPs were obtained to evaluate the vascular anatomy.  Contrast: OMNIPAQUE IOHEXOL 300 MG/ML IV SOLN  Comparison:  None  Findings:  There is cardiomegaly, mild pulmonary edema and small bilateral pleural effusions, right greater than left.  A 4 cm mass is present within the right lobe of thyroid gland. A small amount of pericardial fluid is present.  There is also nonspecific soft tissue fullness in the subcarinal region which may represent reactive adenopathy.  No axillary or hilar adenopathy is identified.  The thoracic aorta has a normal caliber and appearance.  There are no filling defects within either pulmonary arterial system to suggest a segmental or subsegmental pulmonary embolus.  The osseous structures are unremarkable.  The visualized upper abdomen has a normal appearance.  Review of the MIP images confirms the above findings.  IMPRESSION: There is no evidence of pulmonary embolus.  There is mild pulmonary  edema and small bilateral pleural effusions, right greater than left.  There is a 4 cm mass within the right lobe of the thyroid gland. Better characterization with nuclear medicine imaging and / or ultrasound may be of help  if tissue sampling is not obtained.  Original Report Authenticated By: Brandon Melnick, M.D.   US Transvaginal Non-ob  05/09/2011  *RADIOLOGY REPORT*  Clinical Data: Dysfunctional uterine bleeding.  Anemia.  TRANSABDOMINAL AND TRANSVAGINAL ULTRASOUND OF PELVIS  Technique:  Both transabdominal and transvaginal ultrasound examinations of the pelvis were performed.  Transabdominal technique was performed for global imaging of the pelvis including uterus, ovaries, adnexal regions, and pelvic cul-de-sac.  It was necessary to proceed with endovaginal exam following the transabdominal exam to visualize the endometrium and ovaries.  Comparison:  None.  Findings: Uterus:  15.8 x 9.2 x 10.7 cm.  Numerous fibroids are seen involving the uterus diffusely.  These are too numerous to count. There are at least four larger fibroids which measure between 4.4 cm and 5.3 cm in diameter.  Multiple other smaller fibroids are identified.  Endometrium: Not well visualized by either transabdominal or transvaginal sonography due to acoustic shadowing from multiple fibroids.  Right ovary: 4.0 x 2.3 x 3.3 cm.  Normal appearance.  Left ovary: 2.2 x 1.5 x 1.6 cm.  Normal appearance.  Other Findings:  Trace free fluid seen in cul-de-sac.  IMPRESSION:  1.  Moderately enlarged uterus with numerous fibroids, largest measuring 4-5 cm. 2.  Normal ovaries.  No adnexal mass identified.  Original Report Authenticated By: Danae Orleans, M.D.   US Pelvis Complete  05/09/2011  *RADIOLOGY REPORT*  Clinical Data: Dysfunctional uterine bleeding.  Anemia.  TRANSABDOMINAL AND TRANSVAGINAL ULTRASOUND OF PELVIS  Technique:  Both transabdominal and transvaginal ultrasound examinations of the pelvis were performed.  Transabdominal  technique was performed for global imaging of the pelvis including uterus, ovaries, adnexal regions, and pelvic cul-de-sac.  It was necessary to proceed with endovaginal exam following the transabdominal exam to visualize the endometrium and ovaries.  Comparison:  None.  Findings: Uterus:  15.8 x 9.2 x 10.7 cm.  Numerous fibroids are seen involving the uterus diffusely.  These are too numerous to count. There are at least four larger fibroids which measure between 4.4 cm and 5.3 cm in diameter.  Multiple other smaller fibroids are identified.  Endometrium: Not well visualized by either transabdominal or transvaginal sonography due to acoustic shadowing from multiple fibroids.  Right ovary: 4.0 x 2.3 x 3.3 cm.  Normal appearance.  Left ovary:  2.2 x 1.5 x 1.6 cm.  Normal appearance.  Other Findings:  Trace free fluid seen in cul-de-sac.  IMPRESSION:  1.  Moderately enlarged uterus with numerous fibroids, largest measuring 4-5 cm. 2.  Normal ovaries.  No adnexal mass identified.  Original Report Authenticated By: Danae Orleans, M.D.     SignedEarlene Plater MD, [facility] 05/11/2011, 3:39 PM

## 2011-05-12 LAB — TYPE AND SCREEN
ABO/RH(D): O NEG
Unit division: 0
Unit division: 0
Unit division: 0

## 2011-05-30 ENCOUNTER — Ambulatory Visit (INDEPENDENT_AMBULATORY_CARE_PROVIDER_SITE_OTHER): Payer: BC Managed Care – PPO | Admitting: Internal Medicine

## 2011-05-30 ENCOUNTER — Other Ambulatory Visit (INDEPENDENT_AMBULATORY_CARE_PROVIDER_SITE_OTHER): Payer: BC Managed Care – PPO

## 2011-05-30 ENCOUNTER — Encounter: Payer: Self-pay | Admitting: Internal Medicine

## 2011-05-30 DIAGNOSIS — I2789 Other specified pulmonary heart diseases: Secondary | ICD-10-CM

## 2011-05-30 DIAGNOSIS — I2721 Secondary pulmonary arterial hypertension: Secondary | ICD-10-CM

## 2011-05-30 DIAGNOSIS — D219 Benign neoplasm of connective and other soft tissue, unspecified: Secondary | ICD-10-CM

## 2011-05-30 DIAGNOSIS — E041 Nontoxic single thyroid nodule: Secondary | ICD-10-CM

## 2011-05-30 DIAGNOSIS — D259 Leiomyoma of uterus, unspecified: Secondary | ICD-10-CM

## 2011-05-30 DIAGNOSIS — J811 Chronic pulmonary edema: Secondary | ICD-10-CM

## 2011-05-30 DIAGNOSIS — D649 Anemia, unspecified: Secondary | ICD-10-CM

## 2011-05-30 DIAGNOSIS — R05 Cough: Secondary | ICD-10-CM

## 2011-05-30 DIAGNOSIS — N920 Excessive and frequent menstruation with regular cycle: Secondary | ICD-10-CM

## 2011-05-30 LAB — CBC WITH DIFFERENTIAL/PLATELET
Basophils Relative: 1.2 % (ref 0.0–3.0)
Eosinophils Relative: 0.4 % (ref 0.0–5.0)
MCV: 74.8 fl — ABNORMAL LOW (ref 78.0–100.0)
Monocytes Absolute: 0.5 10*3/uL (ref 0.1–1.0)
Monocytes Relative: 10.5 % (ref 3.0–12.0)
Neutrophils Relative %: 71.6 % (ref 43.0–77.0)
Platelets: 253 10*3/uL (ref 150.0–400.0)
RBC: 4.38 Mil/uL (ref 3.87–5.11)
WBC: 5.1 10*3/uL (ref 4.5–10.5)

## 2011-05-30 LAB — FERRITIN: Ferritin: 10.9 ng/mL (ref 10.0–291.0)

## 2011-05-30 LAB — IBC PANEL
Iron: 49 ug/dL (ref 42–145)
Saturation Ratios: 11.8 % — ABNORMAL LOW (ref 20.0–50.0)
Transferrin: 297.5 mg/dL (ref 212.0–360.0)

## 2011-05-30 LAB — FOLATE: Folate: 9.2 ng/mL (ref >5.9)

## 2011-05-30 LAB — VITAMIN B12: Vitamin B-12: 393 pg/mL (ref 211–911)

## 2011-05-30 NOTE — Assessment & Plan Note (Signed)
I have asked her to get an u/s done

## 2011-05-30 NOTE — Assessment & Plan Note (Signed)
Pulmonary and cardiology referrals

## 2011-05-30 NOTE — Assessment & Plan Note (Signed)
This appears to be a complicated, chronic scenario so I have asked her to see pulmonary

## 2011-05-30 NOTE — Patient Instructions (Signed)
Anemia, Nonspecific Your exam and blood tests show you are anemic. This means your blood (hemoglobin) level is low. Normal hemoglobin values are 12 to 15 g/dL for females and 14 to 17 g/dL for males. Make a note of your hemoglobin level today. The hematocrit percent is also used to measure anemia. A normal hematocrit is 38% to 46% in females and 42% to 49% in males. Make a note of your hematocrit level today. CAUSES  Anemia can be due to many different causes.  Excessive bleeding from periods (in women).   Intestinal bleeding.   Poor nutrition.   Kidney, thyroid, liver, and bone marrow diseases.  SYMPTOMS  Anemia can come on suddenly (acute). It can also come on slowly. Symptoms can include:  Minor weakness.   Dizziness.   Palpitations.   Shortness of breath.  Symptoms may be absent until half your hemoglobin is missing if it comes on slowly. Anemia due to acute blood loss from an injury or internal bleeding may require blood transfusion if the loss is severe. Hospital care is needed if you are anemic and there is significant continual blood loss. TREATMENT   Stool tests for blood (Hemoccult) and additional lab tests are often needed. This determines the best treatment.   Further checking on your condition and your response to treatment is very important. It often takes many weeks to correct anemia.  Depending on the cause, treatment can include:  Supplements of iron.   Vitamins B12 and folic acid.   Hormone medicines.If your anemia is due to bleeding, finding the cause of the blood loss is very important. This will help avoid further problems.  SEEK IMMEDIATE MEDICAL CARE IF:   You develop fainting, extreme weakness, shortness of breath, or chest pain.   You develop heavy vaginal bleeding.   You develop bloody or black, tarry stools or vomit up blood.   You develop a high fever, rash, repeated vomiting, or dehydration.  Document Released: 07/03/2004 Document Revised:  02/05/2011 Document Reviewed: 04/10/2009 [employer] Patient Information 2012 [employer]. 

## 2011-05-30 NOTE — Assessment & Plan Note (Signed)
GYN referral 

## 2011-05-30 NOTE — Assessment & Plan Note (Signed)
I will recheck her CBC and will check her vitamin levels 

## 2011-05-30 NOTE — Assessment & Plan Note (Signed)
I will check a BNP today to see if she is in failure

## 2011-05-30 NOTE — Progress Notes (Signed)
Subjective:    Patient ID: Joyce Brooks, female    DOB: 12/30/1960, 50 y.o.   MRN: [id number]  HPI This is a new patient to me who was recently admitted for a severe anemia that required transfusion, the anemia is felt to be due to fibroids (seen on an u/s). Also she was evaluated for a chronic cough and found to have pulmonary edema on cxr and ct scan and a dilated LV and PAH on an echo. She comes in today for f/up and tells me that her cough has not changed much - it is NP and occurs about 4-5 times per day. She does not notice any wheezing or SOB. She also has a right thyroid nodule that was seen on CT scan and needs a f/up u/s done.  Review of Systems  Constitutional: Negative for fever, chills, diaphoresis, activity change, appetite change, fatigue and unexpected weight change.  HENT: Negative.   Eyes: Negative.   Respiratory: Positive for cough. Negative for apnea, choking, chest tightness, shortness of breath, wheezing and stridor.   Cardiovascular: Negative for chest pain, palpitations and leg swelling.  Gastrointestinal: Negative for nausea, vomiting, abdominal pain, diarrhea, constipation and blood in stool.  Genitourinary: Negative.   Musculoskeletal: Negative for myalgias, back pain, joint swelling, arthralgias and gait problem.  Skin: Negative for color change, pallor, rash and wound.  Neurological: Negative for dizziness, tremors, seizures, syncope, facial asymmetry, speech difficulty, weakness, light-headedness, numbness and headaches.  Hematological: Negative for adenopathy. Does not bruise/bleed easily.  Psychiatric/Behavioral: Negative.        Objective:   Physical Exam  Vitals reviewed. Constitutional: She is oriented to person, place, and time. She appears well-developed and well-nourished. No distress.  HENT:  Head: Normocephalic and atraumatic.  Mouth/Throat: Oropharynx is clear and moist. No oropharyngeal exudate.  Eyes: Conjunctivae are normal. Right eye  exhibits no discharge. Left eye exhibits no discharge. No scleral icterus.  Neck: Normal range of motion. Neck supple. Normal carotid pulses, no hepatojugular reflux and no JVD present. No tracheal tenderness present. Carotid bruit is not present. No tracheal deviation present. Mass (she has a soft right thyroid mass) and thyromegaly present.  Cardiovascular: Normal rate, regular rhythm, normal heart sounds and intact distal pulses.  Exam reveals no gallop and no friction rub.   No murmur heard. Pulmonary/Chest: Effort normal and breath sounds normal. No stridor. No respiratory distress. She has no wheezes. She has no rales. She exhibits no tenderness.  Abdominal: Soft. Bowel sounds are normal. She exhibits no distension and no mass. There is no tenderness. There is no rebound and no guarding.  Musculoskeletal: Normal range of motion. She exhibits edema (trace edema in both legs). She exhibits no tenderness.  Lymphadenopathy:    She has no cervical adenopathy.  Neurological: She is oriented to person, place, and time.  Skin: Skin is warm and dry. No rash noted. She is not diaphoretic. No erythema. No pallor.  Psychiatric: She has a normal mood and affect. Her behavior is normal. Judgment and thought content normal.      Lab Results  Component Value Date   WBC 5.7 05/11/2011   HGB 8.1* 05/11/2011   HCT 26.2* 05/11/2011   PLT 254 05/11/2011   GLUCOSE 85 05/11/2011   ALT 7 05/08/2011   AST 13 05/08/2011   NA 137 05/11/2011   K 3.7 05/11/2011   CL 108 05/11/2011   CREATININE 0.62 05/11/2011   BUN 13 05/11/2011   CO2 22 05/11/2011   TSH 1.232  05/11/2011   INR 1.10 05/10/2011      Assessment & Plan:

## 2011-06-02 ENCOUNTER — Encounter: Payer: Self-pay | Admitting: Internal Medicine

## 2011-06-04 ENCOUNTER — Telehealth: Payer: Self-pay

## 2011-06-04 ENCOUNTER — Encounter: Payer: Self-pay | Admitting: Internal Medicine

## 2011-06-04 ENCOUNTER — Ambulatory Visit (INDEPENDENT_AMBULATORY_CARE_PROVIDER_SITE_OTHER): Payer: BC Managed Care – PPO | Admitting: Internal Medicine

## 2011-06-04 DIAGNOSIS — I2789 Other specified pulmonary heart diseases: Secondary | ICD-10-CM

## 2011-06-04 DIAGNOSIS — R05 Cough: Secondary | ICD-10-CM

## 2011-06-04 DIAGNOSIS — I2721 Secondary pulmonary arterial hypertension: Secondary | ICD-10-CM

## 2011-06-04 DIAGNOSIS — E041 Nontoxic single thyroid nodule: Secondary | ICD-10-CM

## 2011-06-04 NOTE — Progress Notes (Signed)
  Subjective:    Patient ID: Joyce Brooks, female    DOB: 1960-12-25, 50 y.o.   MRN: [id number]  HPI  23 yobm never smoker never allergies new cough starting winter of 2012 referred 06/04/2011 by Dr Yetta Barre   06/04/2011 1st pulm eval for lingering cough x 10  months onset acute with ? Viral illness in winter of 2012,  Assoc initially with sob.    Better p transfusion from 3.0 hgb,  which solved the breathing, Coughs on exp to cigarettes, Maybe worse after supper, not typically waking her up. No obvious HB or sinus complaints  Sleeping ok without nocturnal  or early am exacerbation  of respiratory  C/o's Also denies any obvious fluctuation of symptoms with weather or environmental changes or other aggravating or alleviating factors except as outlined above   Review of Systems  Constitutional: Negative for fever and unexpected weight change.  HENT: Negative for ear pain, nosebleeds, congestion, sore throat, rhinorrhea, sneezing, trouble swallowing, dental problem, postnasal drip and sinus pressure.   Eyes: Positive for redness. Negative for itching.  Respiratory: Positive for cough. Negative for chest tightness, shortness of breath and wheezing.   Cardiovascular: Negative for palpitations and leg swelling.  Gastrointestinal: Negative for nausea and vomiting.  Genitourinary: Negative for dysuria.  Musculoskeletal: Negative for joint swelling.  Skin: Negative for rash.  Neurological: Negative for headaches.  Hematological: Bruises/bleeds easily.  Psychiatric/Behavioral: Negative for dysphoric mood. The patient is not nervous/anxious.        Objective:   Physical Exam        Assessment & Plan:

## 2011-06-04 NOTE — Patient Instructions (Signed)
Call Dr Yetta Barre office re: ultrasound of your thyroid which definitely needs to be done  Try prilosec 20mg   Take 30-60 min before first meal of the day and Pepcid 20 mg one bedtime for a month and then return to this clinic if not better  GERD (REFLUX)  is an extremely common cause of respiratory symptoms, many times with no significant heartburn at all.    It can be treated with medication, but also with lifestyle changes including avoidance of late meals, excessive alcohol, smoking cessation, and avoid fatty foods, chocolate, peppermint, colas, red wine, and acidic juices such as orange juice.  NO MINT OR MENTHOL PRODUCTS SO NO COUGH DROPS  USE SUGARLESS CANDY INSTEAD (jolley ranchers or Stover's)  NO OIL BASED VITAMINS - use powdered substitutes.   For cough delsym 2 tsp every 12 hours as needed   You need to avoid all cigarette exposure indefinitely

## 2011-06-04 NOTE — Assessment & Plan Note (Signed)
The most common causes of chronic cough in immunocompetent adults include the following: upper airway cough syndrome (UACS), previously referred to as postnasal drip syndrome (PNDS), which is caused by variety of rhinosinus conditions; (2) asthma; (3) GERD; (4) chronic bronchitis from cigarette smoking or other inhaled environmental irritants; (5) nonasthmatic eosinophilic bronchitis; and (6) bronchiectasis.   These conditions, singly or in combination, have accounted for up to 94% of the causes of chronic cough in prospective studies.   Other conditions have constituted no >6% of the causes in prospective studies These have included bronchogenic carcinoma, chronic interstitial pneumonia, sarcoidosis, left ventricular failure, ACEI-induced cough, and aspiration from a condition associated with pharyngeal dysfunction.  This is most c/w  Classic Upper airway cough syndrome, so named because it's frequently impossible to sort out how much is  CR/sinusitis with freq throat clearing (which can be related to primary GERD)   vs  causing  secondary (" extra esophageal")  GERD from wide swings in gastric pressure that occur with throat clearing, often  promoting self use of mint and menthol lozenges that reduce the lower esophageal sphincter tone and exacerbate the problem further in a cyclical fashion.   These are the same pts who not infrequently have failed to tolerate ace inhibitors,  dry powder inhalers or biphosphonates or report having reflux symptoms that don't respond to standard doses of PPI , and are easily confused as having aecopd or asthma flares.  For now rx with gerd rx and diet.  See instructions for specific recommendations which were reviewed directly with the patient who was given a copy with highlighter outlining the key components.

## 2011-06-04 NOTE — Telephone Encounter (Signed)
Patient stopped by office and completed a walk in sheet stating that she was seen recent by Dr. Sherene Sires who instructed her to contact her PCP and have him order a thyroid ultrasound. Checked Epic and this is stated in pt instructions. Thanks

## 2011-06-04 NOTE — Telephone Encounter (Signed)
This has already been ordered.

## 2011-06-04 NOTE — Assessment & Plan Note (Signed)
Not clear what effect if any the hgb 3 had on the previous echo but should probably be repeated in 6 months even if asymptomatic

## 2011-06-13 ENCOUNTER — Ambulatory Visit: Payer: BC Managed Care – PPO | Admitting: Internal Medicine

## 2011-06-24 ENCOUNTER — Other Ambulatory Visit: Payer: Self-pay | Admitting: Internal Medicine

## 2011-06-24 ENCOUNTER — Encounter: Payer: Self-pay | Admitting: Internal Medicine

## 2011-06-24 ENCOUNTER — Ambulatory Visit
Admission: RE | Admit: 2011-06-24 | Discharge: 2011-06-24 | Disposition: A | Discharge status: Home / Self Care | Payer: BC Managed Care – PPO | Source: Ambulatory Visit | Attending: Internal Medicine | Admitting: Internal Medicine

## 2011-06-24 DIAGNOSIS — E041 Nontoxic single thyroid nodule: Secondary | ICD-10-CM

## 2011-06-26 ENCOUNTER — Institutional Professional Consult (permissible substitution): Payer: BC Managed Care – PPO | Admitting: Cardiology

## 2011-07-03 ENCOUNTER — Institutional Professional Consult (permissible substitution): Payer: BC Managed Care – PPO | Admitting: Cardiology

## 2011-07-09 ENCOUNTER — Ambulatory Visit (INDEPENDENT_AMBULATORY_CARE_PROVIDER_SITE_OTHER): Payer: BC Managed Care – PPO | Admitting: Cardiology

## 2011-07-09 ENCOUNTER — Encounter: Payer: Self-pay | Admitting: Cardiology

## 2011-07-09 VITALS — BP 110/70 | HR 76 | Ht 64.0 in | Wt 139.0 lb

## 2011-07-09 DIAGNOSIS — I2789 Other specified pulmonary heart diseases: Secondary | ICD-10-CM

## 2011-07-09 DIAGNOSIS — I2721 Secondary pulmonary arterial hypertension: Secondary | ICD-10-CM

## 2011-07-09 DIAGNOSIS — I272 Pulmonary hypertension, unspecified: Secondary | ICD-10-CM

## 2011-07-09 NOTE — Assessment & Plan Note (Signed)
Clinically I do not suspect pulmonary hypertension. I question whether this could of been related to her acute and profound anemia. I plan on repeating an echocardiogram in June which we 6 months from the previous. Further evaluation or followup will be based on this result.

## 2011-07-09 NOTE — Patient Instructions (Signed)
The current medical regimen is effective;  continue present plan and medications.  Your physician has requested that you have an echocardiogram. Echocardiography is a painless test that uses sound waves to create images of your heart. It provides your doctor with information about the size and shape of your heart and how well your heart's chambers and valves are working. This procedure takes approximately one hour. There are no restrictions for this procedure.  Follow up as needed. 

## 2011-07-09 NOTE — Progress Notes (Signed)
HPI The patient presents for evaluation of questionable pulmonary hypertension. She has no prior cardiac history. Late last year she was having a persistent cough. She then developed some mild shortness of breath and chest discomfort. She presented to an urgent care where she was found to have a hemoglobin of 3.0.  This was felt to be likely to uterine bleeding.  As part of this she did have an echo which suggested moderately to severely elevated pulmonary presures.  She was transfused and has follow up with gyn.  After her transfusion she reports that her chest discomfort resolved.  She has a mild cough.  She saw Dr. Sherene Sires.  She is referred by her primary provider to follow up the question of pulmonary HTN  She is active but does not exercise.  The patient denies any new symptoms such as chest discomfort, neck or arm discomfort. There has been no new shortness of breath, PND or orthopnea. There have been no reported palpitations, presyncope or syncope.  She has no swelling or weight gain.   No Known Allergies  Current Outpatient Prescriptions  Medication Sig Dispense Refill  . acetaminophen (TYLENOL) 325 MG tablet Take 650 mg by mouth as needed.        . ferrous gluconate (FERGON) 246 (28 FE) MG tablet Take 1 tablet (246 mg total) by mouth daily with breakfast.  60 tablet  0    Past Medical History  Diagnosis Date  . Anemia   . Thyroid disease   . CHF (congestive heart failure)     No past surgical history on file.  Family History  Problem Relation Age of Onset  . Hypertension Other   . Hypertension Mother     History   Social History  . Marital Status: Married    Spouse Name: N/A    Number of Children: 1  . Years of Education: N/A   Occupational History  . assembly work    Social History Main Topics  . Smoking status: Never Smoker   . Smokeless tobacco: Not on file  . Alcohol Use: No  . Drug Use: No  . Sexually Active: Yes    Birth Control/ Protection: None   Other  Topics Concern  . Not on file   Social History Narrative  . No narrative on file    ROS:  As stated in the HPI and negative for all other systems.  PHYSICAL EXAM BP 110/70  Pulse 76  Ht 5\' 4"  (1.626 m)  Wt 139 lb (63.05 kg)  BMI 23.86 kg/m2 GENERAL:  Well appearing HEENT:  Pupils equal round and reactive, fundi not visualized, oral mucosa unremarkable NECK:  No jugular venous distention, waveform within normal limits, carotid upstroke brisk and symmetric, no bruits, no thyromegaly LYMPHATICS:  No cervical, inguinal adenopathy LUNGS:  Clear to auscultation bilaterally BACK:  No CVA tenderness CHEST:  Unremarkable HEART:  PMI not displaced or sustained,S1 and S2 within normal limits, no S3, no S4, no clicks, no rubs, no murmurs ABD:  Flat, positive bowel sounds normal in frequency in pitch, no bruits, no rebound, no guarding, no midline pulsatile mass, no hepatomegaly, no splenomegaly EXT:  2 plus pulses throughout, no edema, no cyanosis no clubbing SKIN:  No rashes no nodules NEURO:  Cranial nerves II through XII grossly intact, motor grossly intact throughout PSYCH:  Cognitively intact, oriented to person place and time  EKG:  05/07/12  sinus rhythm, rate 71, questionable right atrial enlargement, poor anterior R wave progression,  no acute ST-T wave changes  ASSESSMENT AND PLAN

## 2011-07-16 ENCOUNTER — Other Ambulatory Visit (HOSPITAL_COMMUNITY): Payer: BC Managed Care – PPO

## 2011-12-02 ENCOUNTER — Other Ambulatory Visit (HOSPITAL_COMMUNITY): Payer: BC Managed Care – PPO

## 2013-06-03 ENCOUNTER — Other Ambulatory Visit: Payer: Self-pay | Admitting: Obstetrics & Gynecology

## 2013-06-04 ENCOUNTER — Encounter (HOSPITAL_COMMUNITY): Payer: Self-pay

## 2013-06-04 ENCOUNTER — Inpatient Hospital Stay (HOSPITAL_COMMUNITY)
Admission: AD | Admit: 2013-06-04 | Discharge: 2013-06-04 | Disposition: A | Discharge status: Home / Self Care | Payer: BC Managed Care – PPO | Source: Ambulatory Visit | Attending: Obstetrics & Gynecology | Admitting: Obstetrics & Gynecology

## 2013-06-04 DIAGNOSIS — D649 Anemia, unspecified: Secondary | ICD-10-CM | POA: Insufficient documentation

## 2013-06-04 MED ORDER — SODIUM CHLORIDE 0.9 % IV SOLN
1020.0000 mg | Freq: Once | INTRAVENOUS | Status: AC
  Administered 2013-06-04: 1020 mg via INTRAVENOUS
  Filled 2013-06-04: qty 34

## 2013-06-04 NOTE — MAU Note (Signed)
I'm anemic so my doctor wanted me to get IV iron

## 2013-06-06 ENCOUNTER — Emergency Department (HOSPITAL_COMMUNITY): Payer: BC Managed Care – PPO

## 2013-06-06 ENCOUNTER — Encounter (HOSPITAL_COMMUNITY): Payer: Self-pay | Admitting: Emergency Medicine

## 2013-06-06 ENCOUNTER — Emergency Department (HOSPITAL_COMMUNITY)
Admission: EM | Admit: 2013-06-06 | Discharge: 2013-06-06 | Disposition: A | Discharge status: Home / Self Care | Payer: BC Managed Care – PPO | Attending: Emergency Medicine | Admitting: Emergency Medicine

## 2013-06-06 ENCOUNTER — Emergency Department (HOSPITAL_COMMUNITY)
Admission: EM | Admit: 2013-06-06 | Discharge: 2013-06-06 | Disposition: A | Discharge status: Home / Self Care | Payer: BC Managed Care – PPO | Source: Home / Self Care | Attending: Emergency Medicine | Admitting: Emergency Medicine

## 2013-06-06 DIAGNOSIS — K219 Gastro-esophageal reflux disease without esophagitis: Secondary | ICD-10-CM

## 2013-06-06 DIAGNOSIS — Z862 Personal history of diseases of the blood and blood-forming organs and certain disorders involving the immune mechanism: Secondary | ICD-10-CM | POA: Insufficient documentation

## 2013-06-06 DIAGNOSIS — Z79899 Other long term (current) drug therapy: Secondary | ICD-10-CM | POA: Insufficient documentation

## 2013-06-06 DIAGNOSIS — R079 Chest pain, unspecified: Secondary | ICD-10-CM

## 2013-06-06 DIAGNOSIS — Z8639 Personal history of other endocrine, nutritional and metabolic disease: Secondary | ICD-10-CM | POA: Insufficient documentation

## 2013-06-06 LAB — CBC
Hemoglobin: 8.2 g/dL — ABNORMAL LOW (ref 12.0–15.0)
MCH: 17.4 pg — ABNORMAL LOW (ref 26.0–34.0)
MCHC: 27.7 g/dL — ABNORMAL LOW (ref 30.0–36.0)
Platelets: 212 10*3/uL (ref 150–400)
RBC: 4.71 MIL/uL (ref 3.87–5.11)
WBC: 6.6 10*3/uL (ref 4.0–10.5)

## 2013-06-06 LAB — POCT I-STAT TROPONIN I
Troponin i, poc: 0 ng/mL (ref 0.00–0.08)
Troponin i, poc: 0.01 ng/mL (ref 0.00–0.08)

## 2013-06-06 LAB — BASIC METABOLIC PANEL
CO2: 22 mEq/L (ref 19–32)
Calcium: 8.3 mg/dL — ABNORMAL LOW (ref 8.4–10.5)
Creatinine, Ser: 0.52 mg/dL (ref 0.50–1.10)
GFR calc Af Amer: >90 mL/min (ref >90)
Potassium: 3.6 mEq/L (ref 3.5–5.1)
Sodium: 140 mEq/L (ref 135–145)

## 2013-06-06 MED ORDER — GI COCKTAIL ~~LOC~~
ORAL | Status: AC
  Filled 2013-06-06: qty 30

## 2013-06-06 MED ORDER — ASPIRIN 81 MG PO CHEW
324.0000 mg | CHEWABLE_TABLET | Freq: Once | ORAL | Status: AC
  Administered 2013-06-06: 324 mg via ORAL

## 2013-06-06 MED ORDER — NITROGLYCERIN 0.4 MG SL SUBL
0.4000 mg | SUBLINGUAL_TABLET | SUBLINGUAL | Status: AC | PRN
  Administered 2013-06-06: 0.4 mg via SUBLINGUAL

## 2013-06-06 MED ORDER — NITROGLYCERIN 0.4 MG SL SUBL
SUBLINGUAL_TABLET | SUBLINGUAL | Status: AC
  Filled 2013-06-06: qty 25

## 2013-06-06 MED ORDER — ASPIRIN 81 MG PO CHEW
CHEWABLE_TABLET | ORAL | Status: AC
  Filled 2013-06-06: qty 4

## 2013-06-06 MED ORDER — GI COCKTAIL ~~LOC~~
30.0000 mL | Freq: Once | ORAL | Status: AC
  Administered 2013-06-06: 30 mL via ORAL

## 2013-06-06 MED ORDER — OMEPRAZOLE 20 MG PO CPDR: 20.0000 mg | DELAYED_RELEASE_CAPSULE | Freq: Two times a day (BID) | ORAL | Status: DC

## 2013-06-06 MED ORDER — SODIUM CHLORIDE 0.9 % IV SOLN
INTRAVENOUS | Status: DC
  Administered 2013-06-06: 09:00:00 via INTRAVENOUS

## 2013-06-06 NOTE — ED Notes (Signed)
Patient developed chest pressure intermittently seen at Urgent Care today given aspirin 324mg  one SL nitrol and IV left hand 20g. Sent to ED for evaluation.  Pain enroute per [facility] 0/10 upon arrival pain 2/10 chest pressure. Alert answering and following commands appropriate.

## 2013-06-06 NOTE — ED Notes (Signed)
PT states pain is intermittent, tightness and pressure in central chest. PT reports current pain level 2/10. EDP in room evaluating patient.

## 2013-06-06 NOTE — ED Notes (Addendum)
Pt up ambulatory at this time to the bathroom; pt stated "I really need to go before you hook me up to all those wires"; having pt to provide an urine sample as well

## 2013-06-06 NOTE — ED Provider Notes (Signed)
CSN: [id number]     Arrival date & time 06/06/13  0940 History   First MD Initiated Contact with Patient 06/06/13 1025     Chief Complaint  Patient presents with  . Chest Pain   ) HPI  52 year old female with chest pain for 48 hours. She has history of anemia. Iron infusion done at [facility] hospital on Saturday. Tolerated this well. Several hours interval before symptoms started. Started after a nonspecific meal at home. Since that like a burning and a little bit of a pressure. It lasts only a few minutes at a time and seems to go away. It is not exertional. She has not had associated nausea diaphoresis shortness of breath. Has not been dizzy lightheaded or weak he can despite her recent anemia.  Started again on Sunday, yesterday. Has been there most of the time but still intermittent. Again it is not exertional or with any associated symptoms. She has no history of hypertension, diabetes, she is a nonsmoker, no family history of heart disease. She was given a GI cocktail at urgent care. States it continued to come and go but has been less and less.  She was given nitroglycerin and aspirin. However, she states that her pain was essentially gone when she got these. He is pain-free now  Past Medical History  Diagnosis Date  . Anemia   . Thyroid disease     PT denies hx of thyroid disease   Past Surgical History  Procedure Laterality Date  . None    . No past surgeries     Family History  Problem Relation Age of Onset  . Hypertension Mother    History  Substance Use Topics  . Smoking status: Never Smoker   . Smokeless tobacco: Never Used  . Alcohol Use: No   OB History   Grav Para Term Preterm Abortions TAB SAB Ect Mult Living                 Review of Systems  Constitutional: Negative for fever, chills, diaphoresis, appetite change and fatigue.  HENT: Negative for mouth sores, sore throat and trouble swallowing.   Eyes: Negative for visual disturbance.  Respiratory: Positive  for chest tightness. Negative for cough, shortness of breath and wheezing.   Cardiovascular: Positive for chest pain.  Gastrointestinal: Negative for nausea, vomiting, abdominal pain, diarrhea and abdominal distention.  Endocrine: Negative for polydipsia, polyphagia and polyuria.  Genitourinary: Negative for dysuria, frequency and hematuria.  Musculoskeletal: Negative for gait problem.  Skin: Negative for color change, pallor and rash.  Neurological: Negative for dizziness, syncope, light-headedness and headaches.  Hematological: Does not bruise/bleed easily.  Psychiatric/Behavioral: Negative for behavioral problems and confusion.    Allergies  Review of patient's allergies indicates no known allergies.  Home Medications   Current Outpatient Rx  Name  Route  Sig  Dispense  Refill  . DM-Doxylamine-Acetaminophen 15-6.25-325 MG/15ML LIQD   Oral   Take 15 mLs by mouth every 6 (six) hours as needed (for cold symptoms).         . norethindrone (AYGESTIN) 5 MG tablet   Oral   Take 5 mg by mouth daily.         Marland Kitchen omeprazole (PRILOSEC) 20 MG capsule   Oral   Take 1 capsule (20 mg total) by mouth 2 (two) times daily.   60 capsule   0    BP 127/64  Pulse 65  Temp(Src) 98 F (36.7 C) (Oral)  Resp 20  SpO2 99%  LMP 05/25/2013 Physical Exam  Constitutional: She is oriented to person, place, and time. She appears well-developed and well-nourished. No distress.  HENT:  Head: Normocephalic.  Eyes: Conjunctivae are normal. Pupils are equal, round, and reactive to light. No scleral icterus.  Neck: Normal range of motion. Neck supple. No thyromegaly present.  Cardiovascular: Normal rate and regular rhythm.  Exam reveals no gallop and no friction rub.   No murmur heard. Pulmonary/Chest: Effort normal and breath sounds normal. No respiratory distress. She has no wheezes. She has no rales.  Abdominal: Soft. Bowel sounds are normal. She exhibits no distension. There is no tenderness.  There is no rebound.  Musculoskeletal: Normal range of motion.  Neurological: She is alert and oriented to person, place, and time.  Skin: Skin is warm and dry. No rash noted.  Psychiatric: She has a normal mood and affect. Her behavior is normal.    ED Course  Procedures (including critical care time) Labs Review Labs Reviewed  CBC - Abnormal; Notable for the following:    Hemoglobin 8.2 (*)    HCT 29.6 (*)    MCV 62.8 (*)    MCH 17.4 (*)    MCHC 27.7 (*)    RDW 20.2 (*)    All other components within normal limits  BASIC METABOLIC PANEL - Abnormal; Notable for the following:    Calcium 8.3 (*)    All other components within normal limits  POCT I-STAT TROPONIN I  POCT I-STAT TROPONIN I   Imaging Review Dg Chest Port 1 View  06/06/2013   CLINICAL DATA:  Chest pain  EXAM: PORTABLE CHEST - 1 VIEW  COMPARISON:  05/10/2011  FINDINGS: Borderline cardiomegaly. No acute infiltrate or pleural effusion. No pulmonary edema. Minimal elevation of the right hemidiaphragm.  IMPRESSION: No active disease.   Electronically Signed   By: Natasha Mead M.D.   On: 06/06/2013 10:50    EKG Interpretation    Date/Time:  Monday June 06 2013 09:59:31 EST Ventricular Rate:  61 PR Interval:  185 QRS Duration: 111 QT Interval:  419 QTC Calculation: 422 R Axis:   0 Text Interpretation:  Normal sinus rhythm No ST or T wave changes Confirmed by Fayrene Fearing  MD, Hilja Kintzel ([id number]) on 06/06/2013 10:04:57 AM            MDM   1. Chest pain   2. GERD (gastroesophageal reflux disease)     Her troponin is 0.00. Recheck at 0.01. Remains asymptomatic here. I think is appropriate for outpatient cardiology followup. I think this represents angina. For some myocardial injury. Apparently treated with proton pump inhibitor and primary care and cardiology followup.     Rolland Porter, MD 06/06/13 [phone]

## 2013-06-06 NOTE — ED Notes (Signed)
Pt in gown, on monitor, continuous pulse oximetry and blood pressure cuff; warm blankets given

## 2013-06-06 NOTE — ED Notes (Signed)
Notified [facility] 

## 2013-06-06 NOTE — ED Provider Notes (Signed)
Chief Complaint:   Chief Complaint  Patient presents with  . Chest Pain    History of Present Illness:   Joyce Brooks is a 52 year old female who has a three-day history of substernal chest pressure. This radiates to her back and her neck. Her pain is a 3/10 in intensity. The pain has been intermittent and tends to be worse about 30-45 minutes after a meal, and then it gradually fades away. The pain is nonexertional. This began after she received IV iron infusion at [facility] this past Saturday because of anemia secondary to menorrhagia which was in turn due to a fibroid uterus. The patient denies any cardiac history or cardiac risk factors and she's had no nausea, diaphoresis, or shortness of breath. She does note some generalized aching, palpitations, and heartburn. Her husband was recently discharged from the hospital with a STEMI.  Review of Systems:  Other than noted above, the patient denies any of the following symptoms. Systemic:  No fever, chills, sweats, or fatigue. ENT:  No nasal congestion, rhinorrhea, or sore throat. Pulmonary:  No cough, wheezing, shortness of breath, sputum production, hemoptysis. Cardiac:  No palpitations, rapid heartbeat, dizziness, presyncope or syncope. GI:  No abdominal pain, heartburn, nausea, or vomiting. Ext:  No leg pain or swelling.  PMFSH:  Past medical history, family history, social history, meds, and allergies were reviewed and updated as needed. Her only medication is Norethindrone.  Physical Exam:   Vital signs:  BP 134/73  Pulse 74  Temp(Src) 98.7 F (37.1 C) (Oral)  Resp 16  SpO2 98%  LMP 05/25/2013 Gen:  Alert, oriented, in no distress, skin warm and dry. Eye:  PERRL, lids and conjunctivas normal.  Sclera non-icteric. ENT:  Mucous membranes moist, pharynx clear. Neck:  Supple, no adenopathy or tenderness.  No JVD. Lungs:  Clear to auscultation, no wheezes, rales or rhonchi.  No respiratory distress. Heart:  Regular rhythm.  No  gallops, murmers, clicks or rubs. Chest:  No chest wall tenderness. Abdomen:  Soft, nontender, no organomegaly she does have a large midline, lower abdominal mass consistent with a fibroid uterus.  Bowel sounds normal.  No pulsatile abdominal mass or bruit. Ext:  No edema.  No calf tenderness and Homann's sign negative.  Pulses full and equal. Skin:  Warm and dry.  No rash.  EKG:   Date: 06/06/2013  Rate: 62  Rhythm: normal sinus rhythm  QRS Axis: normal  Intervals: Short PR interval.  ST/T Wave abnormalities: normal  Conduction Disutrbances:none  Narrative Interpretation: Sinus rhythm with short PR interval, possible left atrial enlargement, otherwise normal EKG. No acute ischemic  changes.  Old EKG Reviewed: none available  Course in Urgent Care Center:   Taking that this might be reflux esophagitis, the patient was given a dose of GI cocktail. She states this made absolutely no difference in her degree of chest pain and she continues to have 3/10 chest pressure. Therefore, it was decided that she will need to be evaluated at the emergency room. She was begun on IV normal saline at 50 mL per hour, given an aspirin 325 mg by mouth and nitroglycerin 0.4 mg sublingually.  Assessment:  The encounter diagnosis was Chest pain.  This may well be reflux esophagitis or possibly a reaction to the IV iron that she received at [facility] hospital, but with the lack of response to GI cocktail, I think it best that she be evaluated in the emergency room.  Plan:  The patient was transferred to the  ED via [facility] in stable condition.  Medical Decision Making:   52 year old female has a 3 day history of intermittent substernal chest pressure after receiving IV iron at [facility] for severe anemia.  No radiation of pain, nausea, shortness of breath or diaphoresis.  No cardiac risk factors.  Her EKG was normal, but she did not get relief with GI cocktail.  Her husband was recently discharged from the  hospital with a STEMI.       Reuben Likes, MD 06/06/13 [phone]

## 2013-06-06 NOTE — ED Notes (Signed)
Pt c/o chest pain onset Saturday night after receiving IV iron States yest night the pain/discomfort increased... Especially after she ate food Denies: SOB, nauseas, diaphoresis, HA, altered vision She is alert w/no signs of acute distress.

## 2013-06-06 NOTE — ED Notes (Signed)
PT reports feeling tired Saturday evening, but began feeling pain in central chest Sunday 12/28 at around 3pm. PT reports pain at onset being 7/10, but current pain level is 2/10. PT denies nausea, vision changes, vomiting, lightheadedness, unilateral or bilateral weakness.

## 2013-06-06 NOTE — ED Notes (Signed)
Pt placed back on monitor, continuous pulse oximetry and blood pressure

## 2013-11-06 ENCOUNTER — Emergency Department (INDEPENDENT_AMBULATORY_CARE_PROVIDER_SITE_OTHER)
Admission: EM | Admit: 2013-11-06 | Discharge: 2013-11-06 | Disposition: A | Discharge status: Home / Self Care | Payer: BC Managed Care – PPO | Source: Home / Self Care | Attending: Family Medicine | Admitting: Family Medicine

## 2013-11-06 ENCOUNTER — Emergency Department (INDEPENDENT_AMBULATORY_CARE_PROVIDER_SITE_OTHER): Payer: BC Managed Care – PPO

## 2013-11-06 ENCOUNTER — Encounter (HOSPITAL_COMMUNITY): Payer: Self-pay | Admitting: Emergency Medicine

## 2013-11-06 ENCOUNTER — Telehealth (HOSPITAL_COMMUNITY): Payer: Self-pay | Admitting: Family Medicine

## 2013-11-06 DIAGNOSIS — M79609 Pain in unspecified limb: Secondary | ICD-10-CM

## 2013-11-06 DIAGNOSIS — M79672 Pain in left foot: Secondary | ICD-10-CM

## 2013-11-06 LAB — CBC
HCT: 40.5 % (ref 36.0–46.0)
Hemoglobin: 12.6 g/dL (ref 12.0–15.0)
MCH: 26.1 pg (ref 26.0–34.0)
MCHC: 31.1 g/dL (ref 30.0–36.0)
MCV: 84 fL (ref 78.0–100.0)
PLATELETS: 230 10*3/uL (ref 150–400)
RBC: 4.82 MIL/uL (ref 3.87–5.11)
RDW: 15.3 % (ref 11.5–15.5)
WBC: 4 10*3/uL (ref 4.0–10.5)

## 2013-11-06 LAB — POCT I-STAT, CHEM 8
BUN: 10 mg/dL (ref 6–23)
CHLORIDE: 103 meq/L (ref 96–112)
Calcium, Ion: 1.25 mmol/L — ABNORMAL HIGH (ref 1.12–1.23)
Creatinine, Ser: 0.7 mg/dL (ref 0.50–1.10)
Glucose, Bld: 87 mg/dL (ref 70–99)
HCT: 46 % (ref 36.0–46.0)
Hemoglobin: 15.6 g/dL — ABNORMAL HIGH (ref 12.0–15.0)
Potassium: 3.9 mEq/L (ref 3.7–5.3)
SODIUM: 144 meq/L (ref 137–147)
TCO2: 23 mmol/L (ref 0–100)

## 2013-11-06 LAB — D-DIMER, QUANTITATIVE: D-Dimer, Quant: 0.95 ug/mL-FEU — ABNORMAL HIGH (ref 0.00–0.48)

## 2013-11-06 MED ORDER — XARELTO VTE STARTER PACK 15 & 20 MG PO TBPK: 15.0000 mg | ORAL_TABLET | ORAL | Status: DC

## 2013-11-06 MED ORDER — ENOXAPARIN SODIUM 80 MG/0.8ML ~~LOC~~ SOLN
67.0000 mg | Freq: Once | SUBCUTANEOUS | Status: DC
Start: 2013-11-06 — End: 2013-11-06

## 2013-11-06 MED ORDER — ENOXAPARIN SODIUM 80 MG/0.8ML ~~LOC~~ SOLN
65.0000 mg | Freq: Once | SUBCUTANEOUS | Status: DC
  Filled 2013-11-06: qty 0.8

## 2013-11-06 MED ORDER — TRAMADOL HCL 50 MG PO TABS: 50.0000 mg | ORAL_TABLET | Freq: Four times a day (QID) | ORAL | Status: DC | PRN

## 2013-11-06 NOTE — ED Notes (Signed)
Call patient back regarding d-dimer results. Please see office note  Gregor Hams, MD 11/06/13 [id number]

## 2013-11-06 NOTE — ED Provider Notes (Addendum)
Joyce Brooks is a 53 y.o. female who presents to Urgent Care today for left foot swelling and pain occurring over the past one week. Patient denies any injury or increased activity. No radiating pain weakness or numbness. No fevers or chills. No medications tried. Patient feels well otherwise.   Past Medical History  Diagnosis Date  . Anemia   . Thyroid disease     PT denies hx of thyroid disease   History  Substance Use Topics  . Smoking status: Never Smoker   . Smokeless tobacco: Never Used  . Alcohol Use: No   ROS as above Medications: Current Facility-Administered Medications  Medication Dose Route Frequency Provider Last Rate Last Dose  . enoxaparin (LOVENOX) injection 65 mg  65 mg Subcutaneous Once Gregor Hams, MD       Current Outpatient Prescriptions  Medication Sig Dispense Refill  . DM-Doxylamine-Acetaminophen 15-6.25-325 MG/15ML LIQD Take 15 mLs by mouth every 6 (six) hours as needed (for cold symptoms).      . norethindrone (AYGESTIN) 5 MG tablet Take 5 mg by mouth daily.      Marland Kitchen omeprazole (PRILOSEC) 20 MG capsule Take 1 capsule (20 mg total) by mouth 2 (two) times daily.  60 capsule  0  . traMADol (ULTRAM) 50 MG tablet Take 1 tablet (50 mg total) by mouth every 6 (six) hours as needed.  15 tablet  0  . XARELTO STARTER PACK 15 & 20 MG TBPK Take 15-20 mg by mouth as directed. Take as directed on package: Start with one 15mg  tablet by mouth twice a day with food. On Day 22, switch to one 20mg  tablet once a day with food.  51 each  0    Exam:  BP 163/109  Pulse 72  Temp(Src) 98.2 F (36.8 C) (Oral)  Resp 18  SpO2 100%  LMP 10/24/2013 Gen: Well NAD HEENT: EOMI,  MMM Lungs: Normal work of breathing. CTABL Heart: RRR no MRG Abd: NABS, Soft. NT, ND Exts: Brisk capillary refill, warm and well perfused.   left foot: Moderate swelling with no significant ecchymosis and induration or erythema. Tender to touch along the distal metatarsals. Refill sensation and pulses are  intact.  No calf swelling.   Results for orders placed during the hospital encounter of 11/06/13 (from the past 24 hour(s))  D-DIMER, QUANTITATIVE     Status: Abnormal   Collection Time    11/06/13  3:29 PM      Result Value Ref Range   D-Dimer, Quant 0.95 (*) 0.00 - 0.48 ug/mL-FEU  CBC     Status: None   Collection Time    11/06/13  3:29 PM      Result Value Ref Range   WBC 4.0  4.0 - 10.5 K/uL   RBC 4.82  3.87 - 5.11 MIL/uL   Hemoglobin 12.6  12.0 - 15.0 g/dL   HCT 40.5  36.0 - 46.0 %   MCV 84.0  78.0 - 100.0 fL   MCH 26.1  26.0 - 34.0 pg   MCHC 31.1  30.0 - 36.0 g/dL   RDW 15.3  11.5 - 15.5 %   Platelets 230  150 - 400 K/uL  POCT I-STAT, CHEM 8     Status: Abnormal   Collection Time    11/06/13  3:43 PM      Result Value Ref Range   Sodium 144  137 - 147 mEq/L   Potassium 3.9  3.7 - 5.3 mEq/L   Chloride 103  96 -  112 mEq/L   BUN 10  6 - 23 mg/dL   Creatinine, Ser 0.70  0.50 - 1.10 mg/dL   Glucose, Bld 87  70 - 99 mg/dL   Calcium, Ion 1.25 (*) 1.12 - 1.23 mmol/L   TCO2 23  0 - 100 mmol/L   Hemoglobin 15.6 (*) 12.0 - 15.0 g/dL   HCT 46.0  36.0 - 46.0 %   Dg Foot Complete Left  11/06/2013   CLINICAL DATA:  Pain and swelling in the left foot  EXAM: LEFT FOOT - COMPLETE 3+ VIEW  COMPARISON:  None.  FINDINGS: There is soft tissue swelling over the forefoot. No evidence of fracture or dislocation. No radiodense foreign body.  IMPRESSION: Soft tissues swelling without evidence of osseous abnormality.   Electronically Signed   By: Suzy Bouchard M.D.   On: 11/06/2013 15:03    Assessment and Plan: 53 y.o. female with left foot pain. Unclear etiology. D-dimer and CBC are pending. Will call patient with results of abnormal. Suspect metatarsal stress fracture. Pedal edema is also a possibility. Plan to treat with tramadol postoperative shoe and followup with primary care provider.  Discussed warning signs or symptoms. Please see discharge instructions. Patient expresses  understanding.    Gregor Hams, MD 11/06/13 1547  Addendum:  D-dimer elevated.  Patient return for Lovenox injection and is scheduled for vascular ultrasound tomorrow.  Results called to clinic.  Gregor Hams, MD 11/06/13 [id number]

## 2013-11-06 NOTE — Discharge Instructions (Signed)
Thank you for coming in today. I will call you if the labs are abnormal.  Take tramadol for pain as needed.  Use the shoe when active.  Get any ultrasound tomorrow at the hospital..  Call [phone] and ask to speak to the vascular ultrasound lab. Schedule your appointment.  Followup with your Dr.  Edema Edema is an abnormal build-up of fluids in tissues. Because this is partly dependent on gravity (water flows to the lowest place), it is more common in the legs and thighs (lower extremities). It is also common in the looser tissues, like around the eyes. Painless swelling of the feet and ankles is common and increases as a person ages. It may affect both legs and may include the calves or even thighs. When squeezed, the fluid may move out of the affected area and may leave a dent for a few moments. CAUSES   Prolonged standing or sitting in one place for extended periods of time. Movement helps pump tissue fluid into the veins, and absence of movement prevents this, resulting in edema.  Varicose veins. The valves in the veins do not work as well as they should. This causes fluid to leak into the tissues.  Fluid and salt overload.  Injury, burn, or surgery to the leg, ankle, or foot, may damage veins and allow fluid to leak out.  Sunburn damages vessels. Leaky vessels allow fluid to go out into the sunburned tissues.  Allergies (from insect bites or stings, medications or chemicals) cause swelling by allowing vessels to become leaky.  Protein in the blood helps keep fluid in your vessels. Low protein, as in malnutrition, allows fluid to leak out.  Hormonal changes, including pregnancy and menstruation, cause fluid retention. This fluid may leak out of vessels and cause edema.  Medications that cause fluid retention. Examples are sex hormones, blood pressure medications, steroid treatment, or anti-depressants.  Some illnesses cause edema, especially heart failure, kidney disease, or liver  disease.  Surgery that cuts veins or lymph nodes, such as surgery done for the heart or for breast cancer, may result in edema. DIAGNOSIS  Your caregiver is usually easily able to determine what is causing your swelling (edema) by simply asking what is wrong (getting a history) and examining you (doing a physical). Sometimes x-rays, EKG (electrocardiogram or heart tracing), and blood work may be done to evaluate for underlying medical illness. TREATMENT  General treatment includes:  Leg elevation (or elevation of the affected body part).  Restriction of fluid intake.  Prevention of fluid overload.  Compression of the affected body part. Compression with elastic bandages or support stockings squeezes the tissues, preventing fluid from entering and forcing it back into the blood vessels.  Diuretics (also called water pills or fluid pills) pull fluid out of your body in the form of increased urination. These are effective in reducing the swelling, but can have side effects and must be used only under your caregiver's supervision. Diuretics are appropriate only for some types of edema. The specific treatment can be directed at any underlying causes discovered. Heart, liver, or kidney disease should be treated appropriately. HOME CARE INSTRUCTIONS   Elevate the legs (or affected body part) above the level of the heart, while lying down.  Avoid sitting or standing still for prolonged periods of time.  Avoid putting anything directly under the knees when lying down, and do not wear constricting clothing or garters on the upper legs.  Exercising the legs causes the fluid to work back  into the veins and lymphatic channels. This may help the swelling go down.  The pressure applied by elastic bandages or support stockings can help reduce ankle swelling.  A low-salt diet may help reduce fluid retention and decrease the ankle swelling.  Take any medications exactly as prescribed. SEEK MEDICAL  CARE IF:  Your edema is not responding to recommended treatments. SEEK IMMEDIATE MEDICAL CARE IF:   You develop shortness of breath or chest pain.  You cannot breathe when you lay down; or if, while lying down, you have to get up and go to the window to get your breath.  You are having increasing swelling without relief from treatment.  You develop a fever over 102 F (38.9 C).  You develop pain or redness in the areas that are swollen.  Tell your caregiver right away if you have gained 03 lb/1.4 kg in 1 day or 05 lb/2.3 kg in a week. MAKE SURE YOU:   Understand these instructions.  Will watch your condition.  Will get help right away if you are not doing well or get worse. Document Released: 05/26/2005 Document Revised: 11/25/2011 Document Reviewed: 01/12/2008 [facility] Patient Information 2014 [place].   Metatarsal Stress Fracture When too much stress is put on the foot, as in running and jumping sports, the center shaft of the bones of the forefoot is very susceptible to stress fractures (break in bone). This is because of repetitive stress on the bone. This injury is more common if osteoporosis is present or if inadequate running shoes are used. Rapid increase in running distances are often the cause. Running distances should be gradually increased to avoid this problem. Shoes should be used which adequately cushion the foot. Shoes should absorb the shocks of the activity.  DIAGNOSIS  Usually the diagnosis is made by history. The foot progressively becomes sorer with activities. X-rays may be negative (show no break) within the first 2 to 3 weeks of the beginning of pain. A later X-ray may show signs of healing bone (callus formation). A bone scan or MRI will usually make the diagnosis earlier. TREATMENT AND HOME CARE INSTRUCTIONS  Treatment may or may not include a cast, removable fracture boot, or walking shoe. Casts are used for short periods of time to prevent muscle  atrophy (muscle wasting).  Activities should be stopped until further advised by your caregiver.  Wear shoes with adequate shock absorbing abilities.  Alternative exercise may be undertaken while waiting for healing. These may include bicycling and swimming, or as your caregiver suggests. If you do not have a cast or splint:  You may walk on your injured foot as tolerated or advised.  Do not put any weight on your injured foot for as long as directed by your caregiver. Slowly increase the amount of time you walk on the foot as the pain allows or as advised.  Use crutches until you can bear weight without pain. A gradual increase in weight bearing may help.  Apply ice to the injury for 15-20 minutes each hour while awake for the first 2 days. Put the ice in a plastic bag and place a towel between the bag of ice and your skin.  Only take over-the-counter or prescription medicines for pain, discomfort, or fever as directed by your caregiver. SEEK IMMEDIATE MEDICAL CARE IF:   Pain is becoming worse rather than better, or if pain is uncontrolled with medications.  You have increased swelling or redness in the foot. MAKE SURE YOU:  Understand these instructions.  Will watch your condition.  Will get help right away if you are not doing well or get worse. Document Released: 05/23/2000 Document Revised: 08/18/2011 Document Reviewed: 03/21/2008 [facility] Patient Information 2014 [place].

## 2013-11-06 NOTE — ED Notes (Signed)
C/o left foot swelling which started last week  States she did ice and elevate the foot States it started off with the right foot hurting first for a two weeks then stopped Foot is red and swollen; no radiation

## 2013-11-08 ENCOUNTER — Encounter (HOSPITAL_COMMUNITY): Payer: BC Managed Care – PPO

## 2013-11-09 ENCOUNTER — Ambulatory Visit (HOSPITAL_COMMUNITY)
Admission: RE | Admit: 2013-11-09 | Discharge: 2013-11-09 | Disposition: A | Discharge status: Home / Self Care | Payer: BC Managed Care – PPO | Source: Ambulatory Visit | Attending: Internal Medicine | Admitting: Internal Medicine

## 2013-11-09 DIAGNOSIS — M7989 Other specified soft tissue disorders: Secondary | ICD-10-CM | POA: Insufficient documentation

## 2013-11-09 DIAGNOSIS — M79609 Pain in unspecified limb: Secondary | ICD-10-CM | POA: Insufficient documentation

## 2013-11-09 NOTE — Progress Notes (Signed)
*  PRELIMINARY RESULTS* Vascular Ultrasound Left lower extremity venous duplex has been completed.  Preliminary findings: no evidence of DVT.   Landry Mellow, RDMS, RVT  11/09/2013, 2:23 PM

## 2013-11-11 ENCOUNTER — Ambulatory Visit (INDEPENDENT_AMBULATORY_CARE_PROVIDER_SITE_OTHER): Payer: BC Managed Care – PPO | Admitting: Internal Medicine

## 2013-11-11 ENCOUNTER — Encounter: Payer: Self-pay | Admitting: Internal Medicine

## 2013-11-11 VITALS — BP 128/80 | HR 92 | Temp 99.6°F | Wt 155.0 lb

## 2013-11-11 DIAGNOSIS — M109 Gout, unspecified: Secondary | ICD-10-CM | POA: Insufficient documentation

## 2013-11-11 MED ORDER — PREDNISONE 10 MG PO TABS: ORAL_TABLET | ORAL | Status: DC

## 2013-11-11 MED ORDER — METHYLPREDNISOLONE ACETATE 80 MG/ML IJ SUSP
120.0000 mg | Freq: Once | INTRAMUSCULAR | Status: AC
  Administered 2013-11-11: 120 mg via INTRAMUSCULAR

## 2013-11-11 MED ORDER — INDOMETHACIN 50 MG PO CAPS: 50.0000 mg | ORAL_CAPSULE | Freq: Three times a day (TID) | ORAL | Status: DC | PRN

## 2013-11-11 NOTE — Progress Notes (Signed)
Pre visit review using our clinic review tool, if applicable. No additional management support is needed unless otherwise documented below in the visit note. 

## 2013-11-11 NOTE — Progress Notes (Signed)
   Subjective:    Patient ID: Joyce Brooks, female    DOB: 1960/09/05, 53 y.o.   MRN: [id number]  HPI here to f/u, wearing soft shoe cast, states had distal right foot pain without swelling for 3 wks and resolved, but then started similar to the left foot with a vengennce and with marked pain, local erythema and swelling of whole foot to above the ankle.  Seen at [facility] may [facility], tx with lovenox after left foot film neg for fx, wbc normal, glc normal. Ordered for LE venous doppler, was not abl to be done until June 3 but was neg for DVT.  Never started the xarelto and didn't know she was supposed to.  Today overall pain,red,swelling improved, no f/c, but still mod to severe.  Past Medical History  Diagnosis Date  . Anemia   . Thyroid disease     PT denies hx of thyroid disease   Past Surgical History  Procedure Laterality Date  . None    . No past surgeries      reports that she has never smoked. She has never used smokeless tobacco. She reports that she does not drink alcohol or use illicit drugs. family history includes Hypertension in her mother. No Known Allergies Current Outpatient Prescriptions on File Prior to Visit  Medication Sig Dispense Refill  . traMADol (ULTRAM) 50 MG tablet Take 1 tablet (50 mg total) by mouth every 6 (six) hours as needed.  15 tablet  0   No current facility-administered medications on file prior to visit.   Review of Systems All otherwise neg per pt     Objective:   Physical Exam BP 128/80  Pulse 92  Temp(Src) 99.6 F (37.6 C) (Oral)  Wt 155 lb (70.308 kg)  SpO2 99%  LMP 10/24/2013 VS noted,  Constitutional: Pt appears well-developed, well-nourished.  HENT: Head: NCAT.  Right Ear: External ear normal.  Left Ear: External ear normal.  Eyes: . Pupils are equal, round, and reactive to light. Conjunctivae and EOM are normal Neck: Normal range of motion. Neck supple.  Cardiovascular: Normal rate and regular rhythm.   Pulmonary/Chest: Effort normal  and breath sounds normal.  Neurological: Pt is alert. Not confused , motor grossly intact Psychiatric: Pt behavior is normal. No agitation.  Left foot with 2+ edema to ankle, mild erythema and tender area approx 1 cm primarily at distal dorsal foot near 2nd MTP head.  Plantar NT. No fluctuance or drainage. No red streaks     Assessment & Plan:

## 2013-11-11 NOTE — Assessment & Plan Note (Addendum)
New onset, some improved with tramadol since seen in UC may 31, neg for fx and DVT, some low grade temp today but doubt cellulitis or abscess, wbc normal may 31, ok to cont pain med prn, for depomedrol IM, predpac asd, indocin prn for recurrence, uric acid level with next lab draw, consider allopurinol for prevention for recurrence

## 2013-11-11 NOTE — Patient Instructions (Signed)
You had the steroid shot today  Please take all new medication as prescribed - the prednisone to take now, and indocin to take in the future if needed for pain trying to come back  Please continue all other medications as before, including the tramadol for pain now  A uric acid blood test is added to your orders to be done the next time you have lab work done  Please have the pharmacy call with any other refills you may need.  Please continue your efforts at being more active, low cholesterol diet, and weight control.  Please keep your appointments with your specialists as you may have planned

## 2017-07-13 DIAGNOSIS — Z0000 Encounter for general adult medical examination without abnormal findings: Secondary | ICD-10-CM | POA: Diagnosis not present

## 2017-07-13 DIAGNOSIS — D259 Leiomyoma of uterus, unspecified: Secondary | ICD-10-CM | POA: Diagnosis not present

## 2017-07-13 DIAGNOSIS — Z6828 Body mass index (BMI) 28.0-28.9, adult: Secondary | ICD-10-CM | POA: Diagnosis not present

## 2017-07-13 DIAGNOSIS — G4700 Insomnia, unspecified: Secondary | ICD-10-CM | POA: Diagnosis not present

## 2017-07-13 DIAGNOSIS — I10 Essential (primary) hypertension: Secondary | ICD-10-CM | POA: Diagnosis not present

## 2017-07-13 DIAGNOSIS — Z01419 Encounter for gynecological examination (general) (routine) without abnormal findings: Secondary | ICD-10-CM | POA: Diagnosis not present

## 2018-08-06 DIAGNOSIS — Z1151 Encounter for screening for human papillomavirus (HPV): Secondary | ICD-10-CM | POA: Diagnosis not present

## 2018-08-06 DIAGNOSIS — Z01419 Encounter for gynecological examination (general) (routine) without abnormal findings: Secondary | ICD-10-CM | POA: Diagnosis not present

## 2018-08-06 DIAGNOSIS — Z1231 Encounter for screening mammogram for malignant neoplasm of breast: Secondary | ICD-10-CM | POA: Diagnosis not present

## 2018-08-06 DIAGNOSIS — Z6829 Body mass index (BMI) 29.0-29.9, adult: Secondary | ICD-10-CM | POA: Diagnosis not present

## 2018-08-06 DIAGNOSIS — Z113 Encounter for screening for infections with a predominantly sexual mode of transmission: Secondary | ICD-10-CM | POA: Diagnosis not present

## 2018-08-27 DIAGNOSIS — Z0000 Encounter for general adult medical examination without abnormal findings: Secondary | ICD-10-CM | POA: Diagnosis not present

## 2018-08-27 DIAGNOSIS — Z131 Encounter for screening for diabetes mellitus: Secondary | ICD-10-CM | POA: Diagnosis not present

## 2018-08-27 DIAGNOSIS — Z130 Encounter for screening for diseases of the blood and blood-forming organs and certain disorders involving the immune mechanism: Secondary | ICD-10-CM | POA: Diagnosis not present

## 2018-08-27 DIAGNOSIS — Z1329 Encounter for screening for other suspected endocrine disorder: Secondary | ICD-10-CM | POA: Diagnosis not present

## 2018-08-27 DIAGNOSIS — Z13220 Encounter for screening for lipoid disorders: Secondary | ICD-10-CM | POA: Diagnosis not present

## 2019-11-17 ENCOUNTER — Encounter (HOSPITAL_COMMUNITY): Payer: Self-pay

## 2019-11-17 ENCOUNTER — Ambulatory Visit (HOSPITAL_COMMUNITY)
Admission: EM | Admit: 2019-11-17 | Discharge: 2019-11-17 | Disposition: A | Discharge status: Home / Self Care | Payer: Self-pay

## 2019-11-17 ENCOUNTER — Emergency Department (HOSPITAL_COMMUNITY)
Admission: EM | Admit: 2019-11-17 | Discharge: 2019-11-17 | Disposition: A | Discharge status: Home / Self Care | Payer: Self-pay | Attending: Emergency Medicine | Admitting: Emergency Medicine

## 2019-11-17 ENCOUNTER — Emergency Department (HOSPITAL_COMMUNITY): Payer: Self-pay

## 2019-11-17 ENCOUNTER — Other Ambulatory Visit: Payer: Self-pay

## 2019-11-17 ENCOUNTER — Encounter (HOSPITAL_COMMUNITY): Payer: Self-pay | Admitting: Emergency Medicine

## 2019-11-17 DIAGNOSIS — I10 Essential (primary) hypertension: Secondary | ICD-10-CM | POA: Insufficient documentation

## 2019-11-17 DIAGNOSIS — R202 Paresthesia of skin: Secondary | ICD-10-CM | POA: Insufficient documentation

## 2019-11-17 DIAGNOSIS — D649 Anemia, unspecified: Secondary | ICD-10-CM | POA: Insufficient documentation

## 2019-11-17 DIAGNOSIS — R0602 Shortness of breath: Secondary | ICD-10-CM

## 2019-11-17 DIAGNOSIS — R200 Anesthesia of skin: Secondary | ICD-10-CM

## 2019-11-17 HISTORY — DX: Essential (primary) hypertension: I10

## 2019-11-17 LAB — CBC
HCT: 31.9 % — ABNORMAL LOW (ref 36.0–46.0)
Hemoglobin: 8.8 g/dL — ABNORMAL LOW (ref 12.0–15.0)
MCH: 18.6 pg — ABNORMAL LOW (ref 26.0–34.0)
MCHC: 27.6 g/dL — ABNORMAL LOW (ref 30.0–36.0)
MCV: 67.6 fL — ABNORMAL LOW (ref 80.0–100.0)
Platelets: 233 10*3/uL (ref 150–400)
RBC: 4.72 MIL/uL (ref 3.87–5.11)
RDW: 19.9 % — ABNORMAL HIGH (ref 11.5–15.5)
WBC: 3.1 10*3/uL — ABNORMAL LOW (ref 4.0–10.5)
nRBC: 0 % (ref 0.0–0.2)

## 2019-11-17 LAB — I-STAT BETA HCG BLOOD, ED (MC, WL, AP ONLY): I-stat hCG, quantitative: <5 m[IU]/mL (ref <5)

## 2019-11-17 LAB — BASIC METABOLIC PANEL
Anion gap: 8 (ref 5–15)
BUN: 9 mg/dL (ref 6–20)
CO2: 21 mmol/L — ABNORMAL LOW (ref 22–32)
Calcium: 8.9 mg/dL (ref 8.9–10.3)
Chloride: 108 mmol/L (ref 98–111)
Creatinine, Ser: 0.77 mg/dL (ref 0.44–1.00)
GFR calc Af Amer: >60 mL/min (ref >60)
GFR calc non Af Amer: >60 mL/min (ref >60)
Glucose, Bld: 91 mg/dL (ref 70–99)
Potassium: 3.5 mmol/L (ref 3.5–5.1)
Sodium: 137 mmol/L (ref 135–145)

## 2019-11-17 LAB — TROPONIN I (HIGH SENSITIVITY)
Troponin I (High Sensitivity): <2 ng/L (ref <18)
Troponin I (High Sensitivity): <2 ng/L (ref <18)

## 2019-11-17 MED ORDER — SODIUM CHLORIDE 0.9% FLUSH: 3.0000 mL | Freq: Once | INTRAVENOUS | Status: DC

## 2019-11-17 NOTE — ED Provider Notes (Signed)
[facility] EMERGENCY DEPARTMENT Provider Note   CSN: [id number] Arrival date & time: 11/17/19  9924     History Chief Complaint  Patient presents with  . Numbness  . Shortness of Breath  . Gastroesophageal Reflux    Joyce Brooks is a 59 y.o. female w PMHx anemia, HTN, thyroid disease, presenting to the ED with 1 week of SOB.  Shortness of breath comes and goes, worse with exertion.  She feels more winded as opposed to having difficulty breathing.  She also reports intermittent numbness sensation to left shoulder extending down her left arm.  She describes it as her arm feeling like it is asleep.  No pain in her arm.  No neck pain.  She also reports an episode of heartburn/indigestion on Tuesday which was relieved with Rolaids.  Her symptoms are also improved with Excedrin Migraine.  She denies cough, fever, chest pain, history of DVT or PE.  No known cardiac history.  She is asymptomatic on evaluation, last occurrence was early this morning.  The history is provided by the patient.       Past Medical History:  Diagnosis Date  . Anemia   . Hypertension   . Thyroid disease    PT denies hx of thyroid disease    Patient Active Problem List   Diagnosis Date Noted  . Acute gouty arthritis 11/11/2013  . Pulmonary artery hypertension ([place]) 05/30/2011  . Pulmonary edema 05/30/2011  . Fibroids 05/10/2011  . Thyroid nodule 05/10/2011  . Cough 05/09/2011  . Anemia 05/09/2011  . Menorrhagia 05/09/2011    Past Surgical History:  Procedure Laterality Date  . NO PAST SURGERIES    . None       OB History   No obstetric history on file.     Family History  Problem Relation Age of Onset  . Hypertension Mother     Social History   Tobacco Use  . Smoking status: Never Smoker  . Smokeless tobacco: Never Used  Substance Use Topics  . Alcohol use: No  . Drug use: No    Home Medications Prior to Admission medications   Medication Sig Start Date End  Date Taking? Authorizing Provider  lisinopril (ZESTRIL) 5 MG tablet Take 5 mg by mouth daily. 10/08/19   [provider]  norethindrone (AYGESTIN) 5 MG tablet Take 5 mg by mouth daily. 11/11/19   [provider]    Allergies    Patient has no known allergies.  Review of Systems   Review of Systems  All other systems reviewed and are negative.   Physical Exam Updated Vital Signs BP 131/78 (BP Location: Right Arm)   Pulse 64   Temp 98.7 F (37.1 C) (Oral)   Resp 20   SpO2 100%   Physical Exam Vitals and nursing note reviewed.  Constitutional:      General: She is not in acute distress.    Appearance: She is well-developed.  HENT:     Head: Normocephalic and atraumatic.  Eyes:     Conjunctiva/sclera: Conjunctivae normal.  Cardiovascular:     Rate and Rhythm: Normal rate and regular rhythm.     Heart sounds: Normal heart sounds.     Comments: Normal radial pulses. Pulmonary:     Effort: Pulmonary effort is normal. No respiratory distress.     Breath sounds: Normal breath sounds.  Chest:     Chest wall: No tenderness.  Abdominal:     General: Bowel sounds are normal.  Palpations: Abdomen is soft.     Tenderness: There is no abdominal tenderness.  Musculoskeletal:     Right lower leg: No edema.     Left lower leg: No edema.     Comments: Mild tenderness to the posterior shoulder.  Normal range of motion of the neck and arm.  Skin:    General: Skin is warm.  Neurological:     Mental Status: She is alert.     Comments: 5/5 grip strength bilateral upper extremities.  Normal sensation to bilateral upper extremities.  Psychiatric:        Behavior: Behavior normal.     ED Results / Procedures / Treatments   Labs (all labs ordered are listed, but only abnormal results are displayed) Labs Reviewed  BASIC METABOLIC PANEL - Abnormal; Notable for the following components:      Result Value   CO2 21 (*)    All other components within normal limits  CBC  - Abnormal; Notable for the following components:   WBC 3.1 (*)    Hemoglobin 8.8 (*)    HCT 31.9 (*)    MCV 67.6 (*)    MCH 18.6 (*)    MCHC 27.6 (*)    RDW 19.9 (*)    All other components within normal limits  I-STAT BETA HCG BLOOD, ED ([facility], [facility], [facility] ONLY)  TROPONIN I (HIGH SENSITIVITY)  TROPONIN I (HIGH SENSITIVITY)    EKG None  Radiology DG Chest 2 View  Result Date: 11/17/2019 CLINICAL DATA:  Short of breath.  Left arm numbness EXAM: CHEST - 2 VIEW COMPARISON:  05/09/2011 FINDINGS: The heart size and mediastinal contours are within normal limits. Both lungs are clear. The visualized skeletal structures are unremarkable. IMPRESSION: No active cardiopulmonary disease. Electronically Signed   By: Kerby Moors M.D.   On: 11/17/2019 10:05    Procedures Procedures (including critical care time)  Medications Ordered in ED Medications  sodium chloride flush (NS) 0.9 % injection 3 mL (has no administration in time range)    ED Course  I have reviewed the triage vital signs and the nursing notes.  Pertinent labs & imaging results that were available during my care of the patient were reviewed by me and considered in my medical decision making (see chart for details).  Clinical Course as of Nov 17 1443  Thu Nov 17, 2019  1437 Pt states she has a hx of anemia due to iron deficiency and heavy menstrual bleeding. She has not had any issues with menstrual bleeding while on hormone therapy. She has needed transfusion in the past, though states this feels nothing like that.   Hemoglobin(!): 8.8 [JR]    Clinical Course User Index [JR] Emrie Gayle, [place] N, PA-C   MDM Rules/Calculators/A&P                          Patient presenting with intermittent episodes of shortness of breath, left arm paresthesias over the last week.  She is completely asymptomatic on evaluation.  Denies associated chest pain though does have an episode of heartburn/indigestion that was relieved by Rolaids.  She  has no known cardiac history.  No neck or back pain.  On exam, she is well-appearing in no distress.  No focal neuro deficits.  Heart and lung exam is reassuring.  Cardiac work-up with negative troponin x2.  She does have some T wave changes however last EKG for comparison is from 2014.  CBC with anemia,  hemoglobin of 8.8.  She states this feels nothing like prior anemia where she required transfusion.  She has history of anemia secondary to heavy menstrual bleeding and iron deficiency.  She has not had any heavy bleeding recently.  Discussed with patient that if symptoms worsen she needs to be reevaluated.  Recommend continue iron supplementation.   Presentation is inconsistent with stroke, given intermittency and frequency of symptoms.  She has no neuro deficits or symptoms as well in the ED. low suspicion for PE, no tachycardia or hypoxia.  Normal work of breathing.  Low risk [place].    Discussed importance of outpatient follow-up and strict return precautions.  She is well-appearing, in no distress, able to plan and appropriate for discharge.  Patient discussed with Dr. Maryan Rued, who agrees with work-up and plan for discharge. Final Clinical Impression(s) / ED Diagnoses Final diagnoses:  Shortness of breath  Paresthesia  Low hemoglobin    Rx / DC Orders ED Discharge Orders    None       Rogerick Baldwin, [place] N, PA-C 11/17/19 1446    Blanchie Dessert, MD 11/18/19 1847

## 2019-11-17 NOTE — ED Triage Notes (Signed)
Sent from [facility] with c/o shortness of breath, numbness in left arm, and indigestion that started earlier this week. Numbness starts in neck area radiates down left arm.  No facial droop, equal grips, no weakness on left side.

## 2019-11-17 NOTE — Discharge Instructions (Addendum)
We recommend you go to the ER for further evaluation based on your symptoms we are concerned this is cardiac related.

## 2019-11-17 NOTE — ED Triage Notes (Signed)
Pt c/o SOB and left arm numbnessx1 wk. Pt states a wk ago she had indigestion, but has since resolved. Pt has non labored breathing. Lungs are clear. NIH 0.

## 2019-11-17 NOTE — Discharge Instructions (Addendum)
Please follow up closely with primary care to recheck your hemoglobin level. Continue your iron supplements. Return to the ER if you develop worsening symptoms.

## 2019-11-17 NOTE — ED Provider Notes (Signed)
[place]    CSN: [id number] Arrival date & time: 11/17/19  9562      History   Chief Complaint Chief Complaint  Patient presents with  . Shortness of Breath    HPI Joyce Brooks is a 59 y.o. female.   Pt presents with intermittent shortness of breath, left arm numbness, and indigestion x 1 week. Shortness of breath aggravated by walking, alleviated by rest and taking excedrin migraine. Left arm numbness intermittent and alleviated by rest. Last episode indigestion on Monday. Denies chest pain, nausea, vomiting, diarrhea, headaches, recent injuries or pain in neck or back.  ROS per HPI      Past Medical History:  Diagnosis Date  . Anemia   . Hypertension   . Thyroid disease    PT denies hx of thyroid disease    Patient Active Problem List   Diagnosis Date Noted  . Acute gouty arthritis 11/11/2013  . Pulmonary artery hypertension ([place]) 05/30/2011  . Pulmonary edema 05/30/2011  . Fibroids 05/10/2011  . Thyroid nodule 05/10/2011  . Cough 05/09/2011  . Anemia 05/09/2011  . Menorrhagia 05/09/2011    Past Surgical History:  Procedure Laterality Date  . NO PAST SURGERIES    . None      OB History   No obstetric history on file.      Home Medications    Prior to Admission medications   Medication Sig Start Date End Date Taking? Authorizing Provider  lisinopril (ZESTRIL) 5 MG tablet Take 5 mg by mouth daily. 10/08/19   [provider]  norethindrone (AYGESTIN) 5 MG tablet Take 5 mg by mouth daily. 11/11/19   [provider]    Family History Family History  Problem Relation Age of Onset  . Hypertension Mother     Social History Social History   Tobacco Use  . Smoking status: Never Smoker  . Smokeless tobacco: Never Used  Substance Use Topics  . Alcohol use: No  . Drug use: No     Allergies   Patient has no known allergies.   Review of Systems Review of Systems  Constitutional: Positive for fatigue.  HENT:  Negative.   Eyes: Negative.   Respiratory: Positive for shortness of breath. Negative for cough.   Cardiovascular: Negative for chest pain.  Gastrointestinal: Negative for diarrhea, nausea and vomiting.       Indigestion  Musculoskeletal: Negative for back pain and neck pain.  Skin: Negative.   Neurological: Positive for numbness. Negative for dizziness, syncope, weakness and headaches.       Left arm numbness  Hematological: Negative.   Psychiatric/Behavioral: Negative.      Physical Exam Triage Vital Signs ED Triage Vitals  Enc Vitals Group     BP 11/17/19 0820 114/74     Pulse Rate 11/17/19 0820 70     Resp 11/17/19 0820 16     Temp 11/17/19 0820 98.5 F (36.9 C)     Temp Source 11/17/19 0820 Oral     SpO2 11/17/19 0820 100 %     Weight 11/17/19 0825 135 lb (61.2 kg)     Height 11/17/19 0825 5\' 5"  (1.651 m)     Head Circumference --      Peak Flow --      Pain Score 11/17/19 0825 1     Pain Loc --      Pain Edu? --      Excl. in [place]? --    No data found.  Updated Vital  Signs BP 114/74   Pulse 70   Temp 98.5 F (36.9 C) (Oral)   Resp 16   Ht 5\' 5"  (1.651 m)   Wt 135 lb (61.2 kg)   SpO2 100%   BMI 22.47 kg/m   Visual Acuity Right Eye Distance:   Left Eye Distance:   Bilateral Distance:    Right Eye Near:   Left Eye Near:    Bilateral Near:     Physical Exam Constitutional:      General: She is not in acute distress.    Appearance: She is well-developed and normal weight.  HENT:     Head: Normocephalic.     Mouth/Throat:     Mouth: Mucous membranes are moist.     Pharynx: Oropharynx is clear.  Eyes:     Extraocular Movements: Extraocular movements intact.     Pupils: Pupils are equal, round, and reactive to light.  Cardiovascular:     Rate and Rhythm: Normal rate and regular rhythm.     Pulses: Normal pulses.          Radial pulses are 2+ on the right side and 2+ on the left side.     Heart sounds: Normal heart sounds, S1 normal and S2 normal.    Pulmonary:     Effort: Pulmonary effort is normal.     Breath sounds: Normal breath sounds.  Abdominal:     Palpations: Abdomen is soft.     Tenderness: There is no abdominal tenderness.  Musculoskeletal:        General: Normal range of motion.     Cervical back: Normal range of motion.     Right lower leg: No edema.     Left lower leg: No edema.  Skin:    General: Skin is warm and dry.  Neurological:     General: No focal deficit present.     Mental Status: She is alert.     GCS: GCS eye subscore is 4. GCS verbal subscore is 5. GCS motor subscore is 6.     Cranial Nerves: Cranial nerves are intact.     Sensory: Sensation is intact.     Motor: Motor function is intact.     Coordination: Coordination is intact.     Gait: Gait is intact.  Psychiatric:        Mood and Affect: Mood normal.      UC Treatments / Results  Labs (all labs ordered are listed, but only abnormal results are displayed) Labs Reviewed - No data to display  EKG   Radiology DG Chest 2 View  Result Date: 11/17/2019 CLINICAL DATA:  Short of breath.  Left arm numbness EXAM: CHEST - 2 VIEW COMPARISON:  05/09/2011 FINDINGS: The heart size and mediastinal contours are within normal limits. Both lungs are clear. The visualized skeletal structures are unremarkable. IMPRESSION: No active cardiopulmonary disease. Electronically Signed   By: Kerby Moors M.D.   On: 11/17/2019 10:05    Procedures Procedures (including critical care time)  Medications Ordered in UC Medications - No data to display  Initial Impression / Assessment and Plan / UC Course  I have reviewed the triage vital signs and the nursing notes.  Pertinent labs & imaging results that were available during my care of the patient were reviewed by me and considered in my medical decision making (see chart for details).     Shortness of breath and left arm numbness. Patient with signs symptoms concerning of ACS. She has had  some  indigestion, intermittent left arm numbness and shortness of breath. No acute STEMI on EKG. Based on risk factors will send to the ER for further evaluation. Final Clinical Impressions(s) / UC Diagnoses   Final diagnoses:  SOB (shortness of breath)  Arm numbness left     Discharge Instructions     We recommend you go to the ER for further evaluation based on your symptoms we are concerned this is cardiac related.   ED Prescriptions    None     PDMP not reviewed this encounter.   Loura Halt A, NP 11/17/19 1018

## 2021-01-01 ENCOUNTER — Other Ambulatory Visit: Payer: Self-pay | Admitting: Obstetrics & Gynecology

## 2021-01-01 DIAGNOSIS — D259 Leiomyoma of uterus, unspecified: Secondary | ICD-10-CM

## 2021-01-09 ENCOUNTER — Other Ambulatory Visit: Payer: Self-pay

## 2021-01-09 ENCOUNTER — Ambulatory Visit
Admission: RE | Admit: 2021-01-09 | Discharge: 2021-01-09 | Disposition: A | Discharge status: Home / Self Care | Payer: Self-pay | Source: Ambulatory Visit | Attending: Obstetrics & Gynecology | Admitting: Obstetrics & Gynecology

## 2021-01-09 ENCOUNTER — Encounter: Payer: Self-pay | Admitting: *Deleted

## 2021-01-09 DIAGNOSIS — D259 Leiomyoma of uterus, unspecified: Secondary | ICD-10-CM

## 2021-01-09 HISTORY — PX: IR RADIOLOGIST EVAL & MGMT: IMG5224

## 2021-01-09 NOTE — Consult Note (Signed)
Chief Complaint: Patient was consulted remotely today (TeleHealth) for No chief complaint on file.  at the request of Mody,Vaishali.    Referring Physician(s): Mody,Vaishali  History of Present Illness: Joyce Brooks is a 60 y.o. female presenting today as a scheduled consultation for symptomatic uterine fibroids.  She joins [place] today by telemedicine, given the current COVID situation.  We confirmed her identity with 2 identifiers.   She complains of 1 of 3 categories of symptoms of fibroids, bleeding, bulk and pain.   She tells me that regarding her bleeding symptoms, she has had worsening over the past 5-6 year.  Her bleeding cycles are irregular, and can be 1-2 days or as long as 4 days.  The episodes of bleeding are irregularly spaced.  She has a history of anemia related to the hemorrhage.   She does not have significant bulk or pain symptoms.  Past Medical History:  Diagnosis Date   Anemia    Hypertension    Thyroid disease    PT denies hx of thyroid disease    Past Surgical History:  Procedure Laterality Date   IR RADIOLOGIST EVAL & MGMT  01/09/2021   NO PAST SURGERIES     None      Allergies: Patient has no known allergies.  Medications: Prior to Admission medications   Medication Sig Start Date End Date Taking? Authorizing Provider  lisinopril (ZESTRIL) 5 MG tablet Take 5 mg by mouth daily. 10/08/19   [provider]  norethindrone (AYGESTIN) 5 MG tablet Take 5 mg by mouth daily. 11/11/19   [provider]     Family History  Problem Relation Age of Onset   Hypertension Mother     Social History   Socioeconomic History   Marital status: Married    Spouse name: Not on file   Number of children: 1   Years of education: Not on file   Highest education level: Not on file  Occupational History   Occupation: assembly work  Tobacco Use   Smoking status: Never   Smokeless tobacco: Never  Substance and Sexual Activity   Alcohol use: No    Drug use: No   Sexual activity: Yes    Birth control/protection: None  Other Topics Concern   Not on file  Social History Narrative   Not on file   Social Determinants of Health   Financial Resource Strain: Not on file  Food Insecurity: Not on file  Transportation Needs: Not on file  Physical Activity: Not on file  Stress: Not on file  Social Connections: Not on file   Review of Systems  Review of Systems: A 12 point ROS discussed and pertinent positives are indicated in the HPI above.  All other systems are negative.  Physical Exam No direct physical exam was performed   Vital Signs: There were no vitals taken for this visit.  Imaging: IR Radiologist Eval & Mgmt  Result Date: 01/09/2021 Please refer to notes tab for details about interventional procedure. (Op Note)   Labs:  CBC: No results for input(s): WBC, HGB, HCT, PLT in the last 8760 hours.  COAGS: No results for input(s): INR, APTT in the last 8760 hours.  BMP: No results for input(s): NA, K, CL, CO2, GLUCOSE, BUN, CALCIUM, CREATININE, GFRNONAA, GFRAA in the last 8760 hours.  Invalid input(s): CMP  LIVER FUNCTION TESTS: No results for input(s): BILITOT, AST, ALT, ALKPHOS, PROT, ALBUMIN in the last 8760 hours.  TUMOR MARKERS: No results for input(s): AFPTM,  CEA, CA199, CHROMGRNA in the last 8760 hours.  Assessment and Plan:  Joyce Brooks is a 60 y.o. female presenting to IR clinic for discussion of symptomatic uterine fibroids.  Her primary symptoms are bleeding.  I had a lengthy conversation with her regarding the anatomy, pathology, and treatment options for fibroids.  She was counseled on options for treatment of uterine fibroids including doing nothing, hormonal therapy, myomectomy, hysterectomy, and uterine artery embolization.  Regarding UFE, we had a discussion the efficacy, expectations regarding outcomes/long term efficacy, and risk/benefit.    I shared with her a summary of the literature for  UFE efficacy, which generally is accepted to be adequate symptom relief with good restoration of quality of life at 1 year in 90% or greater of patients for bleeding > pain > bulk symptoms.  I also discussed with her that there is a cited rate of retreatment in ~15-20% of patients in the long term, with overall excellent long term utility of symptom relief and QOL.    We discussed the expectations not just for the treatment day and post treatment admission to 23 hour observation, but the first 3 months, 6 months - 12 months, and our follow up.  I recommended that she take 2 weeks off of work to allow her body adequate recovery time.   Regarding risks, specific risks discussed include: post-embolization syndrome, bleeding, infection, contrast reaction, kidney/artery injury, need for further surgery/procedure, including hysterectomy, need for hospitalization, cardiopulmonary collapse, death.    We discussed an approximate 7% chance of early onset menopause, mostly in women >51 years of age, with a less than 1% risk for women less than 79 years of age.  We discussed the need to evaluate the ovarian arteries and in some cases the need to treat the fibroids via the ovarian arteries which can lead to a higher risk of early menopause.    Regarding post-embolization syndrome, I did let her know that this is essentially expected, with a typical prodromal syndrome lasting 4-7 days typically, and usually treated with medication/support such as hydration, rest, analgesics, PO nausea medications, and stool softeners.   We also discussed the need for MRI and possibility that she may be excluded by findings, or possibly requiring a follow up conversation if we feel that Adenomyosis is present/contributing.  After discussing, she would like to proceed with UFE.   Plan:  - Obtain endometrial biopsy (Last biopsy performed in 2013) - Obtain MRI to evaluate uterus/fibroids - Tentatively to proceed with uterine artery  embolization at first available, with Dr. Dwaine Gale   Thank you for this interesting consult.  I greatly enjoyed meeting Joyce Brooks and look forward to participating in their care.  A copy of this report was sent to the requesting provider on this date.  Electronically Signed: Paula Libra Lucella Pommier 01/09/2021, 4:21 PM   I spent a total of  30 Minutes  in remote  clinical consultation, greater than 50% of which was counseling/coordinating care for uterine fibroid embolization.    Visit type: Audio only (telephone). Audio (no video) only due to patient's lack of internet/smartphone capability. Alternative for in-person consultation at [facility], [address] [place], [place], [place]. This visit type was conducted due to national recommendations for restrictions regarding the COVID-19 Pandemic (e.g. social distancing).  This format is felt to be most appropriate for this patient at this time.  All issues noted in this document were discussed and addressed.

## 2021-01-10 ENCOUNTER — Other Ambulatory Visit: Payer: Self-pay | Admitting: Interventional Radiology

## 2021-01-10 DIAGNOSIS — D259 Leiomyoma of uterus, unspecified: Secondary | ICD-10-CM

## 2021-03-02 ENCOUNTER — Ambulatory Visit (HOSPITAL_COMMUNITY)
Admission: RE | Admit: 2021-03-02 | Discharge: 2021-03-02 | Disposition: A | Discharge status: Home / Self Care | Payer: BC Managed Care – PPO | Source: Ambulatory Visit | Attending: Interventional Radiology | Admitting: Interventional Radiology

## 2021-03-02 DIAGNOSIS — D259 Leiomyoma of uterus, unspecified: Secondary | ICD-10-CM | POA: Diagnosis not present

## 2021-03-02 MED ORDER — GADOBUTROL 1 MMOL/ML IV SOLN
6.0000 mL | Freq: Once | INTRAVENOUS | Status: AC | PRN
  Administered 2021-03-02: 6 mL via INTRAVENOUS

## 2021-03-07 ENCOUNTER — Ambulatory Visit
Admission: RE | Admit: 2021-03-07 | Discharge: 2021-03-07 | Disposition: A | Discharge status: Home / Self Care | Payer: BC Managed Care – PPO | Source: Ambulatory Visit | Attending: Interventional Radiology | Admitting: Interventional Radiology

## 2021-03-07 ENCOUNTER — Other Ambulatory Visit: Payer: Self-pay | Admitting: Interventional Radiology

## 2021-03-07 DIAGNOSIS — D250 Submucous leiomyoma of uterus: Secondary | ICD-10-CM

## 2021-03-07 HISTORY — PX: IR RADIOLOGIST EVAL & MGMT: IMG5224

## 2021-03-07 NOTE — Progress Notes (Signed)
Chief Complaint: Uterine fibroids  Referring Physician(s): Cyd Hostler R  History of Present Illness: Joyce Brooks is a 60 y.o. female with history of excessive bleeding during periods due to Uterine Fibroids.  As part of her work up prior to UFE, a MRI of the pelvic was performed.  I spoke with her on the phone today regarding the results of the MRI and necessary actions.    Past Medical History:  Diagnosis Date   Anemia    Hypertension    Thyroid disease    PT denies hx of thyroid disease    Past Surgical History:  Procedure Laterality Date   IR RADIOLOGIST EVAL & MGMT  01/09/2021   NO PAST SURGERIES     None      Allergies: Patient has no known allergies.  Medications: Prior to Admission medications   Medication Sig Start Date End Date Taking? Authorizing Provider  lisinopril (ZESTRIL) 5 MG tablet Take 5 mg by mouth daily. 10/08/19   [provider]  norethindrone (AYGESTIN) 5 MG tablet Take 5 mg by mouth daily. 11/11/19   [provider]     Family History  Problem Relation Age of Onset   Hypertension Mother     Social History   Socioeconomic History   Marital status: Married    Spouse name: Not on file   Number of children: 1   Years of education: Not on file   Highest education level: Not on file  Occupational History   Occupation: assembly work  Tobacco Use   Smoking status: Never   Smokeless tobacco: Never  Substance and Sexual Activity   Alcohol use: No   Drug use: No   Sexual activity: Yes    Birth control/protection: None  Other Topics Concern   Not on file  Social History Narrative   Not on file   Social Determinants of Health   Financial Resource Strain: Not on file  Food Insecurity: Not on file  Transportation Needs: Not on file  Physical Activity: Not on file  Stress: Not on file  Social Connections: Not on file    Review of Systems  Was not evaluated today.  Physical Exam No direct physical exam was  performed (   Vital Signs: None obtained  Imaging: MR PELVIS W WO CONTRAST  Result Date: 03/04/2021 CLINICAL DATA:  Symptomatic uterine fibroids. Postmenopausal bleeding. Urinary frequency. EXAM: MRI PELVIS WITHOUT AND WITH CONTRAST TECHNIQUE: Multiplanar multisequence MR imaging of the pelvis was performed both before and after administration of intravenous contrast. CONTRAST:  57mL GADAVIST GADOBUTROL 1 MMOL/ML IV SOLN COMPARISON:  None. FINDINGS: Lower Urinary Tract: A 7 mm mucosal nodular density is seen within dome of the urinary bladder, suspicious for a small bladder neoplasm. Bowel:  Unremarkable visualized pelvic bowel loops. Vascular/Lymphatic: No pathologically enlarged lymph nodes or other significant abnormality. Reproductive: -- Uterus: Measures 18.2 x 9.6 x 14.8 cm (volume = 1400 cm^3). Numerous fibroids are seen which involve the uterus diffusely. The majority are intramural or submucosal in location, with the largest in the right uterine fundus measuring 8.3 cm in maximum diameter. An 8 mm enhancing soft tissue nodule is seen in the endometrial cavity the lower uterine segment, consistent with an endometrial polyp. Cervix is normal in appearance. -- Intracavitary fibroids:  None. -- Pedunculated fibroids: 2 adjacent pedunculated fibroids are seen which both arise from the posterior lower uterine segment. The more superiorly located fibroid measures 4.7 cm in maximum diameter and has a soft tissue pedicle  of attachment measuring approximately 2.4 cm in thickness (image 32/31). The more inferiorly located pedunculated fibroid measures 6.1 cm in maximum diameter, and has a thicker soft tissue pedicle of attachment measuring 4.5 cm in thickness. A 3rd pedunculated fibroid is also seen arising from the left lateral uterine fundus which measures 4.6 cm and has a soft tissue pedicle of attachment measuring 2.5 cm in thickness. -- Fibroid contrast enhancement: Nearly all fibroids show contrast  enhancement, without significant degeneration/devascularization. A 3.8 cm fibroid in the left anterior upper uterine corpus shows near complete degeneration and calcification. -- Right ovary:  Not visualized, however no adnexal mass identified. -- Left ovary:  Not visualized, however no adnexal mass identified. Other: No abnormal free fluid. Musculoskeletal:  Unremarkable. IMPRESSION: Markedly enlarged myomatous uterus, with 3 pedunculated fibroids, as described above. No intracavitary fibroids identified. 8 mm endometrial polyp in the lower uterine segment. Consider hysteroscopy for further evaluation. 7 mm mucosal nodule in the dome of the urinary bladder, suspicious for urothelial neoplasm. Recommend cystoscopy for further evaluation. Electronically Signed   By: Marlaine Hind M.D.   On: 03/04/2021 11:14    Labs:  CBC: No results for input(s): WBC, HGB, HCT, PLT in the last 8760 hours.  COAGS: No results for input(s): INR, APTT in the last 8760 hours.  BMP: No results for input(s): NA, K, CL, CO2, GLUCOSE, BUN, CALCIUM, CREATININE, GFRNONAA, GFRAA in the last 8760 hours.  Invalid input(s): CMP  LIVER FUNCTION TESTS: No results for input(s): BILITOT, AST, ALT, ALKPHOS, PROT, ALBUMIN in the last 8760 hours.  TUMOR MARKERS: No results for input(s): AFPTM, CEA, CA199, CHROMGRNA in the last 8760 hours.  Assessment and Plan:  60 year old woman with history excessive bleeding related to uterine fibroids is undergoing evaluation for UFE.  MRI of the pelvis was performed as part of the pre procedure work up.  The MRI showed numerous uterine fibroids, as well as, a sub centimeter endometrial polyp and a small bladder mass.  I discussed both findings with the patient and Dr. Benjie Karvonen.  In regards to the endometrial polyp, the patient has had a recent hysterosalpingogram and endometrial biopsy, which showed no evidence of endometrial malignancy.  I discussed the case with Dr. Benjie Karvonen and we both agree that it  is reasonable to move ahead with the UFE.  I will plan on performing a MRI of the Pelvis at 6 months most embolization to again evaluate the polyp.  Dr. Benjie Karvonen will follow her clinically.  In regards to the bladder mass, Dr. Gardiner Coins office will refer to the patient to a Urologist.    Joyce Brooks will be scheduled for the UFE as the earliest available time.  Thank you for this interesting consult.  I greatly enjoyed meeting Joyce Brooks and look forward to participating in their care.  A copy of this report was sent to the requesting provider on this date.  Electronically Signed: Paula Libra Jisselle Poth 03/07/2021, 2:59 PM   I spent a total of    10 Minutes in remote  clinical consultation, greater than 50% of which was counseling/coordinating care for uterine fibroid embolization.    Visit type: Audio only (telephone). Audio (no video) only due to patient's lack of internet/smartphone capability. Alternative for in-person consultation at [facility], [address] [place], [place], [place]. This visit type was conducted due to national recommendations for restrictions regarding the COVID-19 Pandemic (e.g. social distancing).  This format is felt to be most appropriate for this patient at this  time.  All issues noted in this document were discussed and addressed.

## 2021-04-04 ENCOUNTER — Encounter (HOSPITAL_COMMUNITY): Payer: Self-pay | Admitting: Radiology

## 2021-05-06 ENCOUNTER — Other Ambulatory Visit: Payer: Self-pay | Admitting: Urology

## 2021-05-22 ENCOUNTER — Other Ambulatory Visit: Payer: Self-pay

## 2021-05-22 ENCOUNTER — Encounter (HOSPITAL_BASED_OUTPATIENT_CLINIC_OR_DEPARTMENT_OTHER): Payer: Self-pay | Admitting: Urology

## 2021-05-22 NOTE — Progress Notes (Signed)
Spoke w/ via phone for pre-op interview--- Joyce Brooks needs dos----     upt, Istat, ekg          Brooks results------ COVID test -----patient states asymptomatic no test needed Arrive at -------1215 NPO after MN NO Solid Food.  Clear liquids from MN until---1115 Med rec completed Medications to take morning of surgery ----- NONE Diabetic medication ----- Patient instructed no nail polish to be worn day of surgery Patient instructed to bring photo id and insurance card day of surgery Patient aware to have Driver (ride ) / caregiver    for 24 hours after surgery  Patient Special Instructions ----- Pre-Op special Istructions ----- Patient verbalized understanding of instructions that were given at this phone interview. Patient denies shortness of breath, chest pain, fever, cough at this phone interview.

## 2021-05-24 ENCOUNTER — Ambulatory Visit (HOSPITAL_BASED_OUTPATIENT_CLINIC_OR_DEPARTMENT_OTHER)
Admission: RE | Admit: 2021-05-24 | Discharge: 2021-05-24 | Disposition: A | Discharge status: Home / Self Care | Payer: BC Managed Care – PPO | Attending: Urology | Admitting: Urology

## 2021-05-24 ENCOUNTER — Ambulatory Visit (HOSPITAL_BASED_OUTPATIENT_CLINIC_OR_DEPARTMENT_OTHER): Payer: BC Managed Care – PPO | Admitting: Anesthesiology

## 2021-05-24 ENCOUNTER — Other Ambulatory Visit: Payer: Self-pay

## 2021-05-24 ENCOUNTER — Encounter (HOSPITAL_BASED_OUTPATIENT_CLINIC_OR_DEPARTMENT_OTHER): Payer: Self-pay | Admitting: Urology

## 2021-05-24 ENCOUNTER — Encounter (HOSPITAL_BASED_OUTPATIENT_CLINIC_OR_DEPARTMENT_OTHER)
Admission: RE | Disposition: A | Discharge status: Home / Self Care | Payer: Self-pay | Source: Home / Self Care | Attending: Urology

## 2021-05-24 DIAGNOSIS — N3080 Other cystitis without hematuria: Secondary | ICD-10-CM | POA: Diagnosis present

## 2021-05-24 DIAGNOSIS — I10 Essential (primary) hypertension: Secondary | ICD-10-CM | POA: Insufficient documentation

## 2021-05-24 DIAGNOSIS — N3592 Unspecified urethral stricture, female: Secondary | ICD-10-CM | POA: Diagnosis not present

## 2021-05-24 DIAGNOSIS — E669 Obesity, unspecified: Secondary | ICD-10-CM | POA: Insufficient documentation

## 2021-05-24 DIAGNOSIS — Z79899 Other long term (current) drug therapy: Secondary | ICD-10-CM | POA: Diagnosis not present

## 2021-05-24 HISTORY — PX: CYSTOSCOPY WITH URETHRAL DILATATION: SHX5125

## 2021-05-24 HISTORY — PX: TRANSURETHRAL RESECTION OF BLADDER TUMOR: SHX2575

## 2021-05-24 HISTORY — PX: CYSTOSCOPY W/ RETROGRADES: SHX1426

## 2021-05-24 LAB — POCT I-STAT, CHEM 8
BUN: 10 mg/dL (ref 6–20)
Calcium, Ion: 1.18 mmol/L (ref 1.15–1.40)
Chloride: 109 mmol/L (ref 98–111)
Creatinine, Ser: 0.6 mg/dL (ref 0.44–1.00)
Glucose, Bld: 86 mg/dL (ref 70–99)
HCT: 43 % (ref 36.0–46.0)
Hemoglobin: 14.6 g/dL (ref 12.0–15.0)
Potassium: 4.3 mmol/L (ref 3.5–5.1)
Sodium: 142 mmol/L (ref 135–145)
TCO2: 22 mmol/L (ref 22–32)

## 2021-05-24 LAB — POCT PREGNANCY, URINE: Preg Test, Ur: NEGATIVE

## 2021-05-24 SURGERY — TURBT (TRANSURETHRAL RESECTION OF BLADDER TUMOR)
Anesthesia: General

## 2021-05-24 MED ORDER — PHENYLEPHRINE HCL (PRESSORS) 10 MG/ML IV SOLN
INTRAVENOUS | Status: DC | PRN
  Administered 2021-05-24: 80 ug via INTRAVENOUS

## 2021-05-24 MED ORDER — FENTANYL CITRATE (PF) 100 MCG/2ML IJ SOLN: 25.0000 ug | INTRAMUSCULAR | Status: DC | PRN

## 2021-05-24 MED ORDER — ONDANSETRON HCL 4 MG/2ML IJ SOLN
INTRAMUSCULAR | Status: DC | PRN
  Administered 2021-05-24: 4 mg via INTRAVENOUS

## 2021-05-24 MED ORDER — OXYCODONE-ACETAMINOPHEN 5-325 MG PO TABS: 1.0000 | ORAL_TABLET | Freq: Four times a day (QID) | ORAL | 0 refills | Status: AC | PRN

## 2021-05-24 MED ORDER — SODIUM CHLORIDE 0.9 % IR SOLN
Status: DC | PRN
  Administered 2021-05-24: 1000 mL via INTRAVESICAL

## 2021-05-24 MED ORDER — PHENYLEPHRINE 40 MCG/ML (10ML) SYRINGE FOR IV PUSH (FOR BLOOD PRESSURE SUPPORT)
PREFILLED_SYRINGE | INTRAVENOUS | Status: AC
  Filled 2021-05-24: qty 20

## 2021-05-24 MED ORDER — ACETAMINOPHEN 500 MG PO TABS
ORAL_TABLET | ORAL | Status: AC
  Filled 2021-05-24: qty 2

## 2021-05-24 MED ORDER — LIDOCAINE 2% (20 MG/ML) 5 ML SYRINGE
INTRAMUSCULAR | Status: AC
  Filled 2021-05-24: qty 5

## 2021-05-24 MED ORDER — PROPOFOL 10 MG/ML IV BOLUS
INTRAVENOUS | Status: DC | PRN
  Administered 2021-05-24: 200 mg via INTRAVENOUS

## 2021-05-24 MED ORDER — EPHEDRINE SULFATE 50 MG/ML IJ SOLN
INTRAMUSCULAR | Status: DC | PRN
Start: 2021-05-24 — End: 2021-05-24
  Administered 2021-05-24: 10 mg via INTRAVENOUS

## 2021-05-24 MED ORDER — STERILE WATER FOR IRRIGATION IR SOLN
Status: DC | PRN
  Administered 2021-05-24: 3000 mL via INTRAVESICAL

## 2021-05-24 MED ORDER — MIDAZOLAM HCL 2 MG/2ML IJ SOLN
INTRAMUSCULAR | Status: AC
  Filled 2021-05-24: qty 2

## 2021-05-24 MED ORDER — ACETAMINOPHEN 500 MG PO TABS
1000.0000 mg | ORAL_TABLET | Freq: Once | ORAL | Status: AC
  Administered 2021-05-24: 1000 mg via ORAL

## 2021-05-24 MED ORDER — EPHEDRINE 5 MG/ML INJ
INTRAVENOUS | Status: AC
  Filled 2021-05-24: qty 5

## 2021-05-24 MED ORDER — DEXAMETHASONE SODIUM PHOSPHATE 10 MG/ML IJ SOLN
INTRAMUSCULAR | Status: AC
  Filled 2021-05-24: qty 1

## 2021-05-24 MED ORDER — FENTANYL CITRATE (PF) 100 MCG/2ML IJ SOLN
INTRAMUSCULAR | Status: AC
  Filled 2021-05-24: qty 2

## 2021-05-24 MED ORDER — GENTAMICIN SULFATE 40 MG/ML IJ SOLN
5.0000 mg/kg | INTRAVENOUS | Status: AC
  Administered 2021-05-24: 320 mg via INTRAVENOUS
  Filled 2021-05-24: qty 8

## 2021-05-24 MED ORDER — IOHEXOL 300 MG/ML  SOLN
INTRAMUSCULAR | Status: DC | PRN
  Administered 2021-05-24: 10 mL via URETHRAL

## 2021-05-24 MED ORDER — KETOROLAC TROMETHAMINE 30 MG/ML IJ SOLN
INTRAMUSCULAR | Status: AC
  Filled 2021-05-24: qty 1

## 2021-05-24 MED ORDER — MIDAZOLAM HCL 5 MG/5ML IJ SOLN
INTRAMUSCULAR | Status: DC | PRN
  Administered 2021-05-24 (×2): 1 mg via INTRAVENOUS

## 2021-05-24 MED ORDER — PROPOFOL 10 MG/ML IV BOLUS
INTRAVENOUS | Status: AC
  Filled 2021-05-24: qty 20

## 2021-05-24 MED ORDER — FENTANYL CITRATE (PF) 100 MCG/2ML IJ SOLN
INTRAMUSCULAR | Status: DC | PRN
  Administered 2021-05-24 (×2): 50 ug via INTRAVENOUS

## 2021-05-24 MED ORDER — ONDANSETRON HCL 4 MG/2ML IJ SOLN
INTRAMUSCULAR | Status: AC
  Filled 2021-05-24: qty 2

## 2021-05-24 MED ORDER — LACTATED RINGERS IV SOLN: INTRAVENOUS | Status: DC

## 2021-05-24 MED ORDER — SENNOSIDES-DOCUSATE SODIUM 8.6-50 MG PO TABS: 1.0000 | ORAL_TABLET | Freq: Two times a day (BID) | ORAL | 0 refills | Status: DC

## 2021-05-24 MED ORDER — LIDOCAINE HCL (CARDIAC) PF 100 MG/5ML IV SOSY
PREFILLED_SYRINGE | INTRAVENOUS | Status: DC | PRN
  Administered 2021-05-24: 60 mg via INTRAVENOUS

## 2021-05-24 MED ORDER — DEXAMETHASONE SODIUM PHOSPHATE 4 MG/ML IJ SOLN
INTRAMUSCULAR | Status: DC | PRN
  Administered 2021-05-24: 5 mg via INTRAVENOUS

## 2021-05-24 SURGICAL SUPPLY — 44 items
BAG DRAIN URO-CYSTO SKYTR STRL (DRAIN) ×4 IMPLANT
BAG DRN RND TRDRP ANRFLXCHMBR (UROLOGICAL SUPPLIES)
BAG DRN UROCATH (DRAIN) ×2
BAG URINE DRAIN 2000ML AR STRL (UROLOGICAL SUPPLIES) IMPLANT
BAG URINE LEG 500ML (DRAIN) IMPLANT
BALLN NEPHROSTOMY (BALLOONS)
BALLOON NEPHROSTOMY (BALLOONS) IMPLANT
CATH FOLEY 2W COUNCIL 20FR 5CC (CATHETERS) IMPLANT
CATH FOLEY 2W COUNCIL 5CC 16FR (CATHETERS) IMPLANT
CATH FOLEY 2W COUNCIL 5CC 18FR (CATHETERS) IMPLANT
CATH FOLEY 2WAY  3CC 10FR (CATHETERS)
CATH FOLEY 2WAY 3CC 10FR (CATHETERS) IMPLANT
CATH FOLEY 2WAY SLVR  5CC 22FR (CATHETERS)
CATH FOLEY 2WAY SLVR 30CC 20FR (CATHETERS) IMPLANT
CATH FOLEY 2WAY SLVR 5CC 22FR (CATHETERS) IMPLANT
CATH INTERMIT  6FR 70CM (CATHETERS) ×4 IMPLANT
CATH ROBINSON RED A/P 14FR (CATHETERS) IMPLANT
CLOTH BEACON ORANGE TIMEOUT ST (SAFETY) ×8 IMPLANT
ELECT REM PT RETURN 9FT ADLT (ELECTROSURGICAL) ×4
ELECTRODE REM PT RTRN 9FT ADLT (ELECTROSURGICAL) ×2 IMPLANT
EVACUATOR MICROVAS BLADDER (UROLOGICAL SUPPLIES) IMPLANT
GLOVE SURG ENC MOIS LTX SZ7.5 (GLOVE) ×4 IMPLANT
GOWN STRL REUS W/TWL LRG LVL3 (GOWN DISPOSABLE) ×12 IMPLANT
GUIDEWIRE ANG ZIPWIRE 038X150 (WIRE) ×4 IMPLANT
GUIDEWIRE STR DUAL SENSOR (WIRE) IMPLANT
HOLDER FOLEY CATH W/STRAP (MISCELLANEOUS) IMPLANT
IV NS 1000ML (IV SOLUTION) ×4
IV NS 1000ML BAXH (IV SOLUTION) ×2 IMPLANT
IV NS IRRIG 3000ML ARTHROMATIC (IV SOLUTION) ×4 IMPLANT
KIT TURNOVER CYSTO (KITS) ×4 IMPLANT
LOOP CUT BIPOLAR 24F LRG (ELECTROSURGICAL) IMPLANT
MANIFOLD NEPTUNE II (INSTRUMENTS) ×4 IMPLANT
NDL SAFETY ECLIPSE 18X1.5 (NEEDLE) IMPLANT
NEEDLE HYPO 18GX1.5 SHARP (NEEDLE)
NS IRRIG 500ML POUR BTL (IV SOLUTION) ×4 IMPLANT
PACK CYSTO (CUSTOM PROCEDURE TRAY) ×4 IMPLANT
SYR 10ML LL (SYRINGE) IMPLANT
SYR 20ML LL LF (SYRINGE) IMPLANT
SYR TOOMEY IRRIG 70ML (MISCELLANEOUS)
SYRINGE TOOMEY IRRIG 70ML (MISCELLANEOUS) IMPLANT
TUBE CONNECTING 12'X1/4 (SUCTIONS)
TUBE CONNECTING 12X1/4 (SUCTIONS) IMPLANT
TUBING UROLOGY SET (TUBING) ×2 IMPLANT
WATER STERILE IRR 3000ML UROMA (IV SOLUTION) ×4 IMPLANT

## 2021-05-24 NOTE — H&P (Signed)
Joyce Brooks is an 60 y.o. female.    Chief Complaint: Pre-Op Urethral Dilation, Cystoscopy, Bilateral Retrogrades, Possible Bladder Biopsy v. Resection of Neoplasm  HPI:   1 - Rule Out Bladder Cancer / Urachal Lesions - incidetnal bladder doem lesion on MRI 02/2021 for fibroids (very large fibroid uterus). Non smoker. NO h/o umbilcal drainage. Lesion in area of bladder dome / midline. Unable to perform office cysto due to meatal stenosis.   2 - Urinary Urgency / Nocturia - modest bother form irritative symptoms, no incontinence. Did not tollerate oxybutynin.   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 No c/o obsturctive voiding.   PMH sig for obesity, HTN, fibroids (follows [name] MD GYN). No ischemic CV disease / blood thinners. 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   Today " [place] " is seen  to proceed with operative cystoscopy to further characterize bladder dome lesion. No interval fevers. Most recent UA without infectious parameters.   Past Medical History:  Diagnosis Date   Anemia    Hypertension    Thyroid disease    PT denies hx of thyroid disease    Past Surgical History:  Procedure Laterality Date   IR RADIOLOGIST EVAL & MGMT  01/09/2021   IR RADIOLOGIST EVAL & MGMT  03/07/2021   NO PAST SURGERIES     None      Family History  Problem Relation Age of Onset   Hypertension Mother    Social History:  reports that she has never smoked. She has never used smokeless tobacco. She reports that she does not drink alcohol and does not use drugs.  Allergies: No Known Allergies  No medications prior to admission.    No results found for this or any previous visit (from the past 48 hour(s)). No results found.  Review of Systems  Constitutional:  Negative for chills and fever.  Genitourinary:  Positive for frequency and urgency.  All other systems reviewed and  are negative.  Height 5\' 4"  (1.626 m), weight 72.6 kg, last menstrual period 05/19/2021. Physical Exam Vitals reviewed.  HENT:     Head: Normocephalic.     Nose: Nose normal.  Eyes:     Pupils: Pupils are equal, round, and reactive to light.  Abdominal:     General: Abdomen is flat.     Comments: Stable mild truncal obesity.   Genitourinary:    Comments: No CVAT Musculoskeletal:        General: Normal range of motion.     Cervical back: Normal range of motion.  Skin:    General: Skin is warm.  Neurological:     General: No focal deficit present.     Mental Status: She is alert.     Assessment/Plan  Proceed as planned with cysto, urethral dilations, bilateral retrogrades, possible bladder BX or resecgion of dome neoplasm with goal of ruling out urachal cancer. Risks, benefits, alternatives, expected peri-op course discussed previously and reiterated today.   Alexis Frock, MD 05/24/2021, 5:54 AM

## 2021-05-24 NOTE — Anesthesia Procedure Notes (Signed)
Procedure Name: LMA Insertion Date/Time: 05/24/2021 1:27 PM Performed by: Bufford Spikes, CRNA Pre-anesthesia Checklist: Patient identified, Emergency Drugs available, Suction available and Patient being monitored Patient Re-evaluated:Patient Re-evaluated prior to induction Oxygen Delivery Method: Circle system utilized Preoxygenation: Pre-oxygenation with 100% oxygen Induction Type: IV induction Ventilation: Mask ventilation without difficulty LMA: LMA inserted LMA Size: 4.0 Number of attempts: 1 Placement Confirmation: positive ETCO2 Tube secured with: Tape Dental Injury: Teeth and Oropharynx as per pre-operative assessment

## 2021-05-24 NOTE — Brief Op Note (Signed)
05/24/2021  1:52 PM  PATIENT:  Joyce Brooks  60 y.o. female  PRE-OPERATIVE DIAGNOSIS:  URETHRAL STENOSIS, BLADDER MASS  POST-OPERATIVE DIAGNOSIS:  URETHRAL STENOSIS, BLADDER MASS  PROCEDURE:  Procedure(s) with comments: POSSIBLE TRANSURETHRAL RESECTION OF BLADDER TUMOR (TURBT) (N/A) - 45 MINS CYSTOSCOPY WITH RETROGRADE PYELOGRAM (Bilateral) CYSTOSCOPY WITH URETHRAL DILATATION (N/A)  SURGEON:  Surgeon(s) and Role:    * Alexis Frock, MD - Primary  PHYSICIAN ASSISTANT:   ASSISTANTS: none   ANESTHESIA:   general  EBL:  25 mL   BLOOD ADMINISTERED:none  DRAINS: none   LOCAL MEDICATIONS USED:  NONE  SPECIMEN:  Source of Specimen:  bladder dome mass  DISPOSITION OF SPECIMEN:  PATHOLOGY  COUNTS:  YES  TOURNIQUET:  * No tourniquets in log *  DICTATION: .Other Dictation: Dictation Number [id number]  PLAN OF CARE: Discharge to home after PACU  PATIENT DISPOSITION:  PACU - hemodynamically stable.   Delay start of Pharmacological VTE agent (>24hrs) due to surgical blood loss or risk of bleeding: not applicable

## 2021-05-24 NOTE — Discharge Instructions (Addendum)
1 - You may have urinary urgency (bladder spasms) and bloody urine on / off for up to a week. This is normal.  2 - Call MD or go to ER for fever >102, severe pain / nausea / vomiting not relieved by medications, or acute change in medical status    Post Anesthesia Home Care Instructions  Activity: Get plenty of rest for the remainder of the day. A responsible individual must stay with you for 24 hours following the procedure.  For the next 24 hours, DO NOT: -Drive a car -Paediatric nurse -Drink alcoholic beverages -Take any medication unless instructed by your physician -Make any legal decisions or sign important papers.  Meals: Start with liquid foods such as gelatin or soup. Progress to regular foods as tolerated. Avoid greasy, spicy, heavy foods. If nausea and/or vomiting occur, drink only clear liquids until the nausea and/or vomiting subsides. Call your physician if vomiting continues.  Special Instructions/Symptoms: Your throat may feel dry or sore from the anesthesia or the breathing tube placed in your throat during surgery. If this causes discomfort, gargle with warm salt water. The discomfort should disappear within 24 hours.  If you had a scopolamine patch placed behind your ear for the management of post- operative nausea and/or vomiting:  1. The medication in the patch is effective for 72 hours, after which it should be removed.  Wrap patch in a tissue and discard in the trash. Wash hands thoroughly with soap and water. 2. You may remove the patch earlier than 72 hours if you experience unpleasant side effects which may include dry mouth, dizziness or visual disturbances. 3. Avoid touching the patch. Wash your hands with soap and water after contact with the patch.

## 2021-05-24 NOTE — Anesthesia Postprocedure Evaluation (Signed)
Anesthesia Post Note  Patient: Joyce Brooks  Procedure(s) Performed: TRANSURETHRAL RESECTION OF BLADDER TUMOR (TURBT) CYSTOSCOPY WITH RETROGRADE PYELOGRAM (Bilateral) CYSTOSCOPY WITH URETHRAL DILATATION     Patient location during evaluation: PACU Anesthesia Type: General Level of consciousness: awake and alert Pain management: pain level controlled Vital Signs Assessment: post-procedure vital signs reviewed and stable Respiratory status: spontaneous breathing, nonlabored ventilation and respiratory function stable Cardiovascular status: blood pressure returned to baseline and stable Postop Assessment: no apparent nausea or vomiting Anesthetic complications: no   No notable events documented.  Last Vitals:  Vitals:   05/24/21 1415 05/24/21 1430  BP: 132/82 129/75  Pulse: 91 80  Resp: 18 14  Temp:    SpO2: 97% 97%    Last Pain:  Vitals:   05/24/21 1430  TempSrc:   PainSc: 0-No pain                 Marshel Golubski,W. EDMOND

## 2021-05-24 NOTE — Anesthesia Preprocedure Evaluation (Addendum)
Anesthesia Evaluation  Patient identified by MRN, date of birth, ID band Patient awake    Reviewed: Allergy & Precautions, H&P , NPO status , Patient's Chart, lab work & pertinent test results  Airway Mallampati: II  TM Distance: >3 FB Neck ROM: Full    Dental no notable dental hx. (+) Teeth Intact, Dental Advisory Given   Pulmonary neg pulmonary ROS,    Pulmonary exam normal breath sounds clear to auscultation       Cardiovascular hypertension, Pt. on medications  Rhythm:Regular Rate:Normal     Neuro/Psych negative neurological ROS  negative psych ROS   GI/Hepatic negative GI ROS, Neg liver ROS,   Endo/Other  negative endocrine ROS  Renal/GU negative Renal ROS  negative genitourinary   Musculoskeletal  (+) Arthritis , Osteoarthritis,    Abdominal   Peds  Hematology  (+) Blood dyscrasia, anemia ,   Anesthesia Other Findings   Reproductive/Obstetrics negative OB ROS                            Anesthesia Physical Anesthesia Plan  ASA: 2  Anesthesia Plan: General   Post-op Pain Management: Tylenol PO (pre-op)   Induction: Intravenous  PONV Risk Score and Plan: 4 or greater and Ondansetron, Dexamethasone and Midazolam  Airway Management Planned: LMA  Additional Equipment:   Intra-op Plan:   Post-operative Plan: Extubation in OR  Informed Consent: I have reviewed the patients History and Physical, chart, labs and discussed the procedure including the risks, benefits and alternatives for the proposed anesthesia with the patient or authorized representative who has indicated his/her understanding and acceptance.     Dental advisory given  Plan Discussed with: CRNA  Anesthesia Plan Comments:         Anesthesia Quick Evaluation

## 2021-05-24 NOTE — Transfer of Care (Signed)
Immediate Anesthesia Transfer of Care Note  Patient: [name]  Procedure(s) Performed: POSSIBLE TRANSURETHRAL RESECTION OF BLADDER TUMOR (TURBT) CYSTOSCOPY WITH RETROGRADE PYELOGRAM (Bilateral) CYSTOSCOPY WITH URETHRAL DILATATION  Patient Location: PACU  Anesthesia Type:General  Level of Consciousness: awake, alert  and oriented  Airway & Oxygen Therapy: Patient Spontanous Breathing and Patient connected to nasal cannula oxygen  Post-op Assessment: Report given to RN and Post -op Vital signs reviewed and stable  Post vital signs: Reviewed and stable  Last Vitals:  Vitals Value Taken Time  BP 136/78 05/24/21 1404  Temp    Pulse    Resp 21 05/24/21 1406  SpO2    Vitals shown include unvalidated device data.  Last Pain:  Vitals:   05/24/21 1047  TempSrc: Oral  PainSc: 0-No pain         Complications: No notable events documented.

## 2021-05-27 ENCOUNTER — Encounter (HOSPITAL_BASED_OUTPATIENT_CLINIC_OR_DEPARTMENT_OTHER): Payer: Self-pay | Admitting: Urology

## 2021-05-27 LAB — SURGICAL PATHOLOGY

## 2021-05-27 NOTE — Op Note (Signed)
NAMEALYSHA, Joyce Brooks MEDICAL RECORD NO: [id number] ACCOUNT NO: [id number] DATE OF BIRTH: 1961/01/16 FACILITY: [facility] LOCATION: [facility] PHYSICIAN: Alexis Frock, MD  Operative Report   DATE OF PROCEDURE: 05/24/2021  PREOPERATIVE DIAGNOSES:  Bladder dome mass, rule out urachal neoplasm and urethral stenosis.  PROCEDURE PERFORMED: 1.  Urethral dilation. 2.  Cystoscopy with bilateral retrograde pyelograms interpretation. 3.  Bladder biopsy, fulguration.  ESTIMATED BLOOD LOSS:  Nil.  COMPLICATIONS:  None.  SPECIMEN:  Bladder dome mass for permanent pathology.  FINDINGS:  1.  Mild urethral stenosis, approximately 12-French diameter urethra pre-dilation, 26-French post-dilation. 2.  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 3.  Unremarkable bilateral retrograde pyelograms.  INDICATIONS:  The patient is a very pleasant 60 year old lady with a history of a very large fibroid uterus.  In the midst of evaluation for this was noted to have an incidental bladder dome mass, concerning for possible urachal neoplasm.  She denies  history of gross hematuria or umbilical drainage.  She underwent an attempt at office cystoscopy to confirm diagnosis; however, her urethra was quite narrow in caliber, did not tolerate even the small 16-French cystoscope.  It was felt the safest means  of management would be operative cystoscopy with serial dilation with goal of ruling out significant urachal neoplasm.  Informed consent was obtained and placed in the medical record.  PROCEDURE IN DETAIL:  The patient being verified, procedure being cystoscopy, bilateral retrogrades, urethral dilation and biopsy and fulguration was confirmed.  Procedure timeout was performed.  Intravenous antibiotics administered and general  anesthesia introduced.  The patient was placed into a low lithotomy position.  Sterile field was created, prepped and draped the patient's vagina,  introitus and proximal thigh using iodine.  Next, a urethral calibration and dilation was performed with  serial female sounds, starting at 12-French, in 2-French increments up to 26-French.  Next, cystourethroscopy was performed using 21-French rigid cystoscope with offset lens.  Inspection of the urinary bladder revealed no diverticula, calcifications,  papillary lesions.  Ureteral orifices were singleton.  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  Attention turned to the retrograde pyelograms.  The left  ureteral orifice was cannulated with a 6-French end-hole catheter and left retrograde pyelogram was obtained.  Left retrograde pyelogram demonstrated single left ureter, single system left kidney.  No filling defects or narrowing noted.  Similarly, right retrograde pyelogram was obtained.  Right retrograde pyelogram demonstrated single right ureter, single system right kidney.  No filling defects or narrowing noted.  Next, using cold cup biopsy forceps, two small mucosal bites were taken from the area of the bladder dome cystic area, one  clearly encompassing the cyst component.  These were set aside for permanent pathology, labeled as bladder dome mass.  Reinspection revealed no evidence of through and through bladder perforation.  The biopsy area was then fulgurated with Bugbee  electrode, which resulted in excellent hemostasis.  Bladder was emptied per cystoscope, procedure then terminated.  The patient tolerated the procedure well, no immediate perioperative complications.  The patient was taken to postanesthesia care in  stable condition.  Plan for discharge home.   SHW D: 05/24/2021 1:57:24 pm T: 05/25/2021 5:19:00 am  JOB: [id number]/ [id number]

## 2022-02-08 IMAGING — MR MR PELVIS WO/W CM
20 series · 48 of 48 positions shown · IV contrast (gadavist)
Comparison: None.

CLINICAL DATA: Symptomatic uterine fibroids. Postmenopausal
bleeding. Urinary frequency.

EXAM:
MRI PELVIS WITHOUT AND WITH CONTRAST
TECHNIQUE: Multiplanar multisequence MR imaging of the pelvis was performed
both before and after administration of intravenous contrast.
CONTRAST:  6mL GADAVIST GADOBUTROL 1 MMOL/ML IV SOLN

[Series 2: T2 · coronal · 6.0mm · 1.56mm/px · 1 of 30 slices shown (1 of 4)]
[im 1/30]
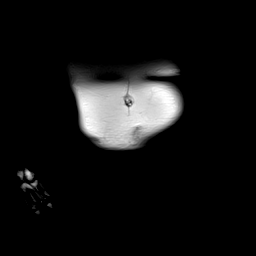

[Series 3: T2 · axial · 5.0mm · 0.51mm/px · 1 of 41 slices shown (2 of 4)]
[im 1/41]
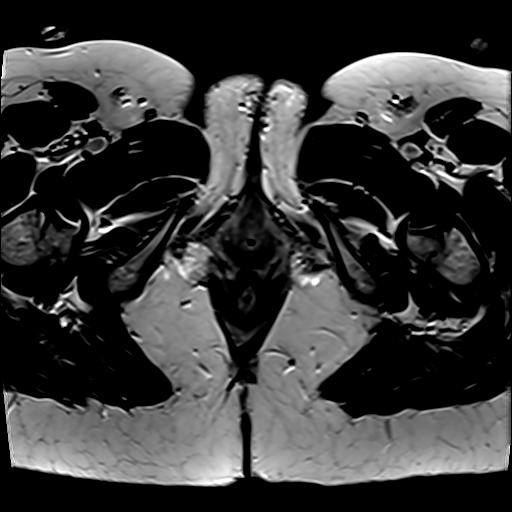

[Series 4: T2 · coronal · 4.0mm · 0.51mm/px · 1 of 45 slices shown (3 of 4)]
[im 1/45]
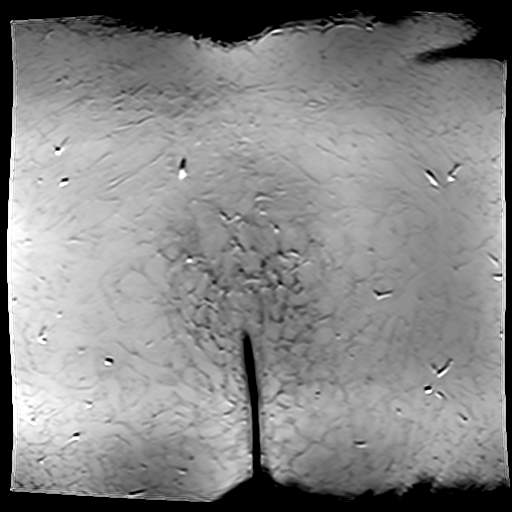

[Series 5: T2 fat-sat · axial · 5.0mm · 0.51mm/px · 1 of 41 slices shown]
[im 1/41]
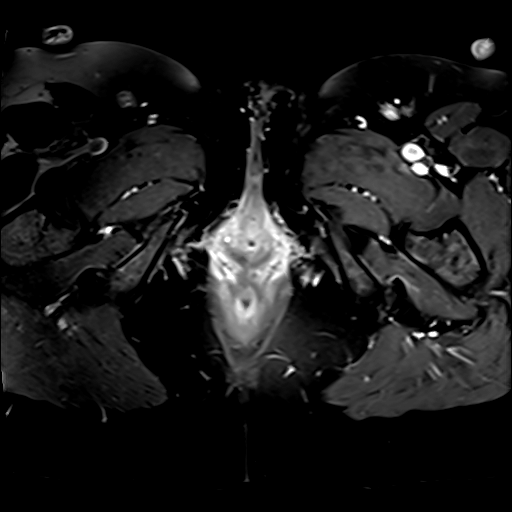

[Series 6: T2 · sagittal · 5.0mm · 0.55mm/px · 1 of 40 slices shown (4 of 4)]
[im 1/40]
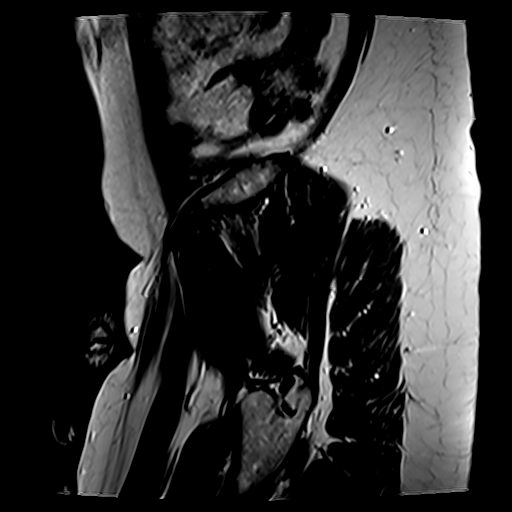

[Series 7: T1 · axial · 4.0mm · 0.84mm/px · z∈[-76,+208]mm · 3 of 72 slices shown (1 of 2)]
[im 1/72]
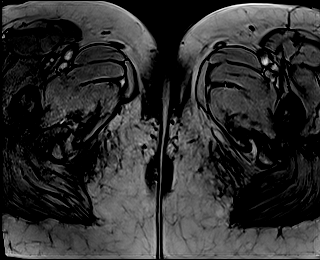
[im 36/72]
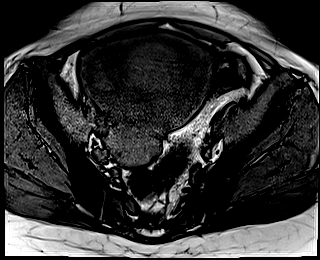
[im 72/72]
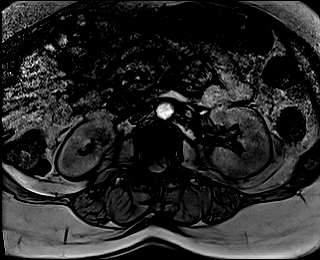

[Series 8: T1 · axial · 4.0mm · 0.84mm/px · z∈[-76,+208]mm · 3 of 72 slices shown (2 of 2)]
[im 1/72]
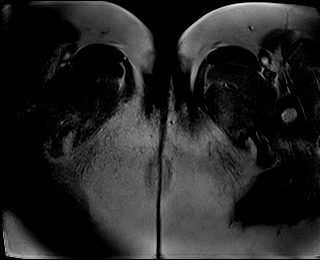
[im 36/72]
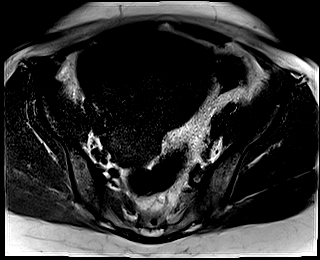
[im 72/72]
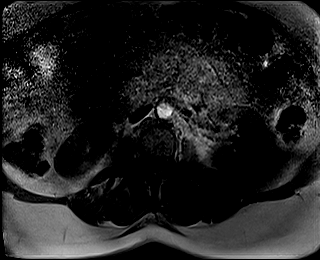

[Series 9: DWI · axial · 5.0mm · 3.50mm/px · z∈[-18,+202]mm · 5 of 135 slices shown (1 of 3)]
[im 1/135]
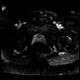
[im 34/135]
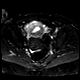
[im 68/135]
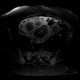
[im 101/135]
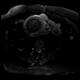
[im 135/135]
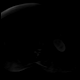

[Series 10: DWI · axial · 5.0mm · 3.50mm/px · z∈[-18,+202]mm · 2 of 45 slices shown (2 of 3)]
[im 1/45]
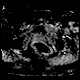
[im 45/45]
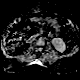

[Series 11: DWI · axial · 5.0mm · 3.50mm/px · z∈[-18,+202]mm · 2 of 45 slices shown (3 of 3)]
[im 1/45]
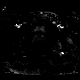
[im 45/45]
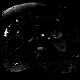

[Series 13: T1 dynamic · axial · 3.0mm · 0.94mm/px · z∈[-35,+202]mm · 3 of 80 slices shown (1 of 7)]
[im 1/80]
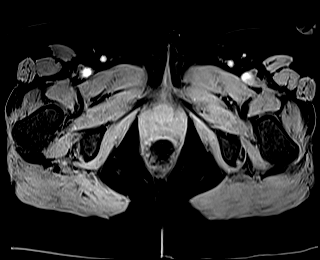
[im 40/80]
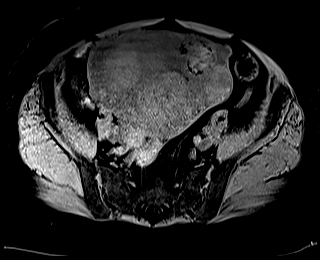
[im 80/80]
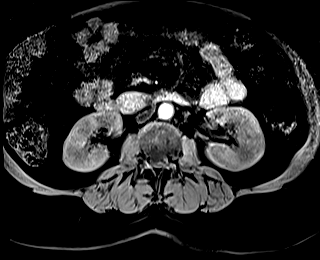

[Series 17: T1 dynamic · axial · 3.0mm · 0.94mm/px · z∈[-35,+202]mm · 3 of 80 slices shown (2 of 7)]
[im 1/80]
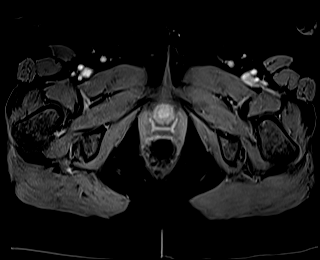
[im 40/80]
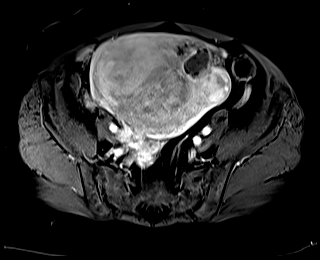
[im 80/80]
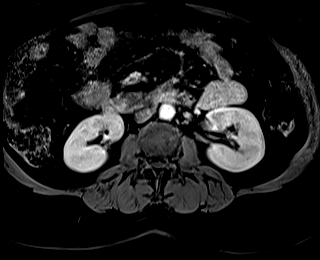

[Series 18: T1 dynamic · axial · 3.0mm · 0.94mm/px · z∈[-35,+202]mm · 3 of 80 slices shown (3 of 7)]
[im 1/80]
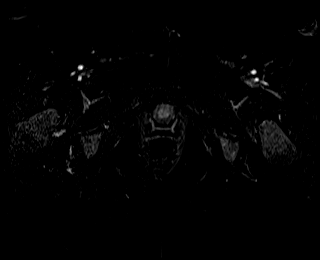
[im 40/80]
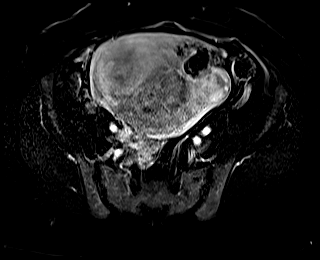
[im 80/80]
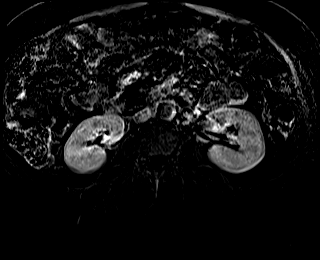

[Series 21: T1 dynamic · axial · 3.0mm · 0.94mm/px · z∈[-35,+202]mm · 3 of 80 slices shown (4 of 7)]
[im 1/80]
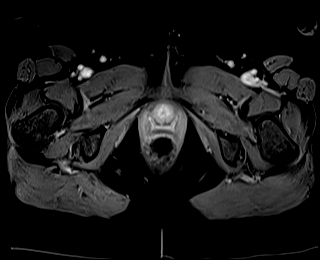
[im 40/80]
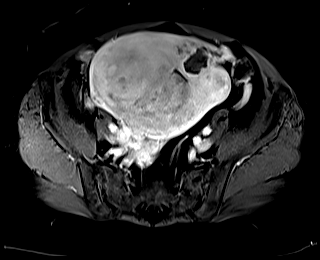
[im 80/80]
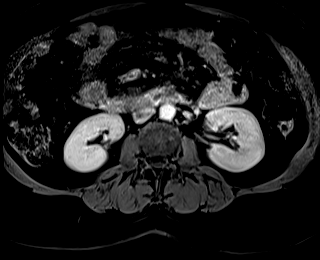

[Series 22: T1 dynamic · axial · 3.0mm · 0.94mm/px · z∈[-35,+202]mm · 3 of 80 slices shown (5 of 7)]
[im 1/80]
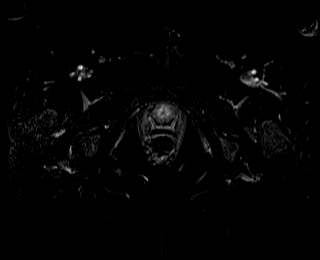
[im 40/80]
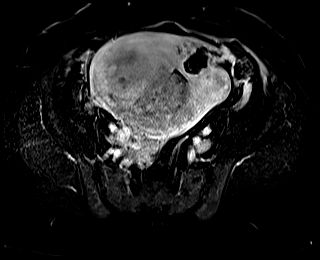
[im 80/80]
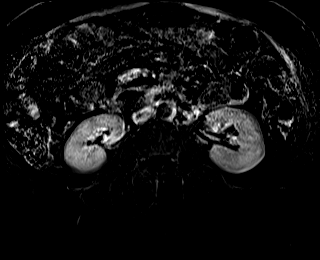

[Series 25: T1 dynamic · axial · 3.0mm · 0.94mm/px · z∈[-35,+202]mm · 3 of 80 slices shown (6 of 7)]
[im 1/80]
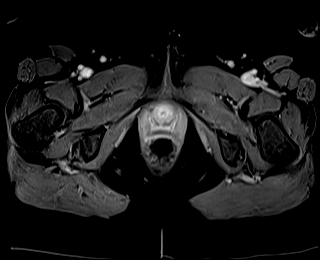
[im 40/80]
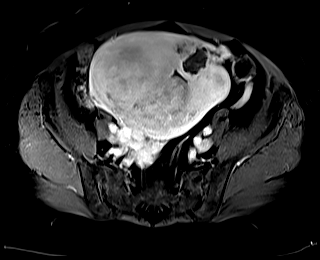
[im 80/80]
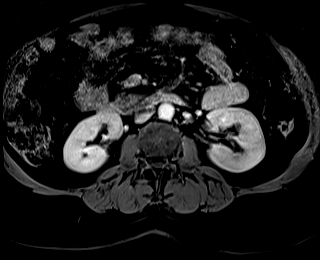

[Series 26: T1 dynamic · axial · 3.0mm · 0.94mm/px · z∈[-35,+202]mm · 3 of 80 slices shown (7 of 7)]
[im 1/80]
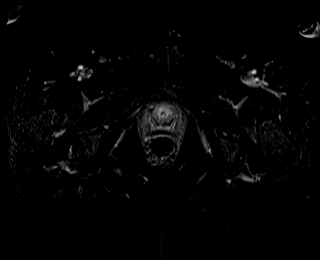
[im 40/80]
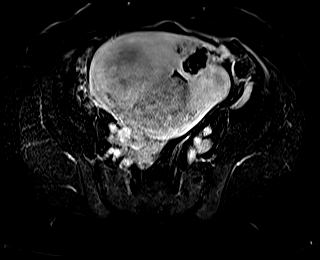
[im 80/80]
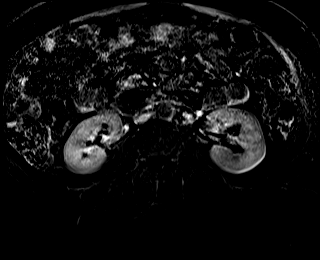

[Series 28: T1 dynamic post-contrast · sagittal · 3.0mm · 1.06mm/px · 3 of 88 slices shown (1 of 2)]
[im 1/88]
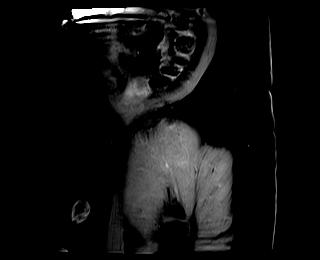
[im 44/88]
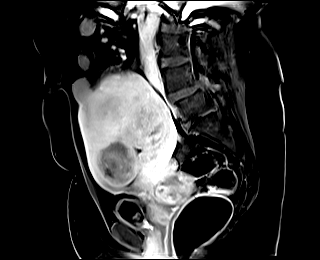
[im 88/88]
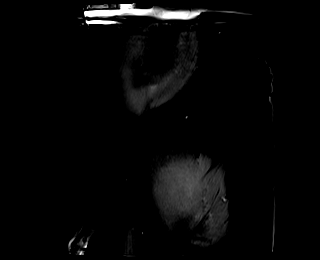

[Series 29: T2 post-contrast · sagittal · 5.0mm · 0.55mm/px · 1 of 40 slices shown]
[im 1/40]
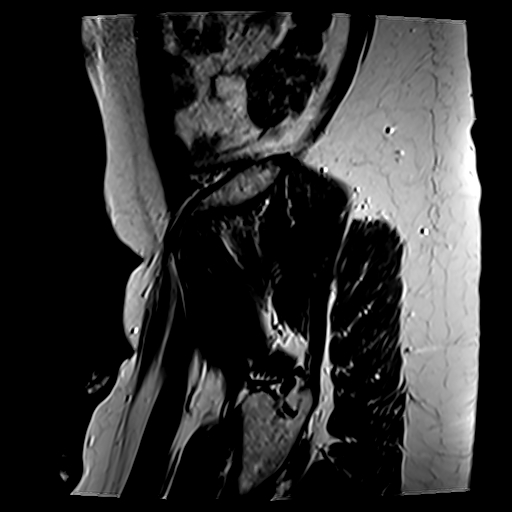

[Series 31: T1 dynamic post-contrast · sagittal · 3.0mm · 0.88mm/px · 3 of 80 slices shown (2 of 2)]
[im 1/80]
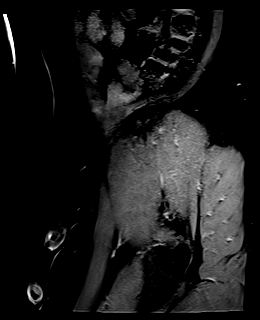
[im 40/80]
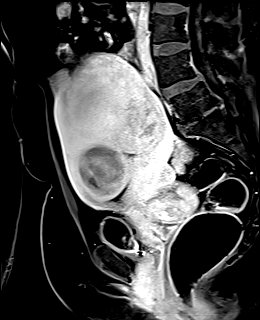
[im 80/80]
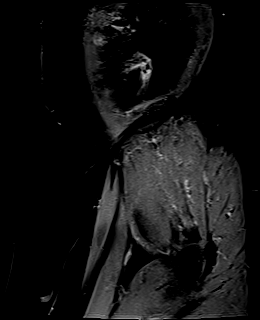

[48 of 48 positions shown; findings below may reference images not displayed]

FINDINGS: Lower Urinary Tract: A 7 mm mucosal nodular density is seen within
dome of the urinary bladder, suspicious for a small bladder
neoplasm.

Bowel:  Unremarkable visualized pelvic bowel loops.

Vascular/Lymphatic: No pathologically enlarged lymph nodes or other
significant abnormality.

Reproductive:

-- Uterus: Measures 18.2 x 9.6 x 14.8 cm (volume = 9233 cm^3).
Numerous fibroids are seen which involve the uterus diffusely. The
majority are intramural or submucosal in location, with the largest
in the right uterine fundus measuring 8.3 cm in maximum diameter.

An 8 mm enhancing soft tissue nodule is seen in the endometrial
cavity the lower uterine segment, consistent with an endometrial
polyp. Cervix is normal in appearance.

-- Intracavitary fibroids:  None.

-- Pedunculated fibroids: 2 adjacent pedunculated fibroids are seen
which both arise from the posterior lower uterine segment. The more
superiorly located fibroid measures 4.7 cm in maximum diameter and
has a soft tissue pedicle of attachment measuring approximately
cm in thickness (image 32/31). The more inferiorly located
pedunculated fibroid measures 6.1 cm in maximum diameter, and has a
thicker soft tissue pedicle of attachment measuring 4.5 cm in
thickness. A 3rd pedunculated fibroid is also seen arising from the
left lateral uterine fundus which measures 4.6 cm and has a soft
tissue pedicle of attachment measuring 2.5 cm in thickness.

-- Fibroid contrast enhancement: Nearly all fibroids show contrast
enhancement, without significant degeneration/devascularization. A
3.8 cm fibroid in the left anterior upper uterine corpus shows near
complete degeneration and calcification.

-- Right ovary:  Not visualized, however no adnexal mass identified.

-- Left ovary:  Not visualized, however no adnexal mass identified.

Other: No abnormal free fluid.

Musculoskeletal:  Unremarkable.
IMPRESSION: Markedly enlarged myomatous uterus, with 3 pedunculated fibroids, as
described above. No intracavitary fibroids identified.

8 mm endometrial polyp in the lower uterine segment. Consider
hysteroscopy for further evaluation.

7 mm mucosal nodule in the dome of the urinary bladder, suspicious
for urothelial neoplasm. Recommend cystoscopy for further
evaluation.

## 2023-04-14 LAB — COLOGUARD

## 2023-04-14 LAB — EXTERNAL GENERIC LAB PROCEDURE

## 2023-05-10 LAB — COLOGUARD: COLOGUARD: NEGATIVE

## 2023-05-10 LAB — EXTERNAL GENERIC LAB PROCEDURE: COLOGUARD: NEGATIVE

## 2023-07-15 ENCOUNTER — Telehealth (INDEPENDENT_AMBULATORY_CARE_PROVIDER_SITE_OTHER): Payer: Self-pay | Admitting: Primary Care

## 2023-07-15 NOTE — Telephone Encounter (Signed)
 Called pt to remind them about atp. Pt will be present

## 2023-07-16 ENCOUNTER — Encounter (INDEPENDENT_AMBULATORY_CARE_PROVIDER_SITE_OTHER): Payer: Self-pay | Admitting: Primary Care

## 2023-07-16 ENCOUNTER — Ambulatory Visit (INDEPENDENT_AMBULATORY_CARE_PROVIDER_SITE_OTHER): Payer: BC Managed Care – PPO | Admitting: Primary Care

## 2023-07-16 VITALS — BP 142/84 | HR 73 | Resp 16 | Ht 65.0 in | Wt 183.6 lb

## 2023-07-16 DIAGNOSIS — H5712 Ocular pain, left eye: Secondary | ICD-10-CM

## 2023-07-16 DIAGNOSIS — H00015 Hordeolum externum left lower eyelid: Secondary | ICD-10-CM

## 2023-07-16 DIAGNOSIS — I10 Essential (primary) hypertension: Secondary | ICD-10-CM

## 2023-07-16 DIAGNOSIS — Z114 Encounter for screening for human immunodeficiency virus [HIV]: Secondary | ICD-10-CM

## 2023-07-16 DIAGNOSIS — E66811 Obesity, class 1: Secondary | ICD-10-CM

## 2023-07-16 DIAGNOSIS — E6609 Other obesity due to excess calories: Secondary | ICD-10-CM | POA: Diagnosis not present

## 2023-07-16 DIAGNOSIS — N926 Irregular menstruation, unspecified: Secondary | ICD-10-CM

## 2023-07-16 DIAGNOSIS — Z6830 Body mass index (BMI) 30.0-30.9, adult: Secondary | ICD-10-CM

## 2023-07-16 DIAGNOSIS — Z7689 Persons encountering health services in other specified circumstances: Secondary | ICD-10-CM

## 2023-07-16 DIAGNOSIS — Z1159 Encounter for screening for other viral diseases: Secondary | ICD-10-CM

## 2023-07-16 DIAGNOSIS — Z2821 Immunization not carried out because of patient refusal: Secondary | ICD-10-CM

## 2023-07-16 MED ORDER — LISINOPRIL 5 MG PO TABS: 5.0000 mg | ORAL_TABLET | Freq: Every day | ORAL | 1 refills | Status: DC

## 2023-07-16 NOTE — Progress Notes (Signed)
 New Patient Office Visit  Subjective    Patient ID: Joyce Brooks female  DOB: 1960/06/22  Age: 63 y.o. MRN: [id number]   CC:  Left eye painful with discharge.  HPI  Joyce Brooks is a 63 year old obese female she is in today to establish care.  She also has menstrual cycles monthly.  She is followed by gynecology. Dr. Dewey  Current Outpatient Medications on File Prior to Visit  Medication Sig Dispense Refill   lisinopril  (ZESTRIL ) 5 MG tablet Take 5 mg by mouth daily.     No current facility-administered medications on file prior to visit.     No Known Allergies  Past Medical History:  Diagnosis Date   Anemia    Hypertension    Thyroid  disease    PT denies hx of thyroid  disease     Past Surgical History:  Procedure Laterality Date   CYSTOSCOPY W/ RETROGRADES Bilateral 05/24/2021   Procedure: CYSTOSCOPY WITH RETROGRADE PYELOGRAM;  Surgeon: Alvaro Hummer, MD;  [facility];  Service: Urology;  Laterality: Bilateral;   CYSTOSCOPY WITH URETHRAL DILATATION N/A 05/24/2021   Procedure: CYSTOSCOPY WITH URETHRAL DILATATION;  Surgeon: Alvaro Hummer, MD;  Location: [facility];  Service: Urology;  Laterality: N/A;   IR RADIOLOGIST EVAL & MGMT  01/09/2021   IR RADIOLOGIST EVAL & MGMT  03/07/2021   NO PAST SURGERIES     None     TRANSURETHRAL RESECTION OF BLADDER TUMOR N/A 05/24/2021   Procedure: TRANSURETHRAL RESECTION OF BLADDER TUMOR (TURBT);  Surgeon: Alvaro Hummer, MD;  Location: [facility];  Service: Urology;  Laterality: N/A;  45 MINS     Family History  Problem Relation Age of Onset   Hypertension Mother     Social History   Socioeconomic History   Marital status: Married    Spouse name: Not on file   Number of children: 1   Years of education: Not on file   Highest education level: Not on file  Occupational History   Occupation: assembly work  Tobacco Use   Smoking status: Never   Smokeless tobacco:  Never  Substance and Sexual Activity   Alcohol use: No   Drug use: No   Sexual activity: Yes    Birth control/protection: None  Other Topics Concern   Not on file  Social History Narrative   Not on file   Social Drivers of Health   Financial Resource Strain: Not on file  Food Insecurity: No Food Insecurity (07/16/2023)   Hunger Vital Sign    Worried About Running Out of Food in the Last Year: Never true    Ran Out of Food in the Last Year: Never true  Transportation Needs: No Transportation Needs (07/16/2023)   PRAPARE - Administrator, Civil Service (Medical): No    Lack of Transportation (Non-Medical): No  Physical Activity: Not on file  Stress: Not on file  Social Connections: Not on file  Intimate Partner Violence: Not At Risk (07/16/2023)   Humiliation, Afraid, Rape, and Kick questionnaire    Fear of Current or Ex-Partner: No    Emotionally Abused: No    Physically Abused: No    Sexually Abused: No    SDOH Interventions Today    Flowsheet Row Most Recent Value  SDOH Interventions   Food Insecurity Interventions Intervention Not Indicated  Housing Interventions Intervention Not Indicated  Transportation Interventions Intervention Not Indicated  Utilities Interventions Intervention Not Indicated  Health Maintenance  Topic Date Due   HIV Screening  Never done   Hepatitis C Screening  Never done   Pap with HPV screening  Never done   COVID-19 Vaccine (1 - 2024-25 season) Never done   Flu Shot  09/07/2023*   Zoster (Shingles) Vaccine (1 of 2) 10/13/2023*   DTaP/Tdap/Td vaccine (1 - Tdap) 07/15/2024*   Mammogram  01/05/2025   Cologuard (Stool DNA test)  05/01/2026   HPV Vaccine  Aged Out  *Topic was postponed. The date shown is not the original due date.    Objective    BP (!) 142/84 (BP Location: Right Arm, Patient Position: Sitting, Cuff Size: Normal)   Pulse 73   Resp 16   Ht 5' 5 (1.651 m)   Wt 183 lb 9.6 oz (83.3 kg)   SpO2 100%    BMI 30.55 kg/m   Physical Exam Vitals reviewed.  Constitutional:      Appearance: Normal appearance.  HENT:     Head: Normocephalic.     Right Ear: Tympanic membrane, ear canal and external ear normal.     Left Ear: Tympanic membrane, ear canal and external ear normal.     Nose: Nose normal.     Mouth/Throat:     Mouth: Mucous membranes are moist.  Eyes:     Extraocular Movements: Extraocular movements intact.     Pupils: Pupils are equal, round, and reactive to light.  Cardiovascular:     Rate and Rhythm: Normal rate.  Pulmonary:     Effort: Pulmonary effort is normal.     Breath sounds: Normal breath sounds.  Abdominal:     General: Bowel sounds are normal.     Palpations: Abdomen is soft.  Musculoskeletal:        General: Normal range of motion.     Cervical back: Normal range of motion.  Skin:    General: Skin is warm and dry.  Neurological:     Mental Status: She is alert and oriented to person, place, and time.  Psychiatric:        Mood and Affect: Mood normal.        Behavior: Behavior normal.        Thought Content: Thought content normal.     Assessment & Plan:  Nadege was seen today for new patient (initial visit).  Diagnoses and all orders for this visit:  Encounter to establish care   Influenza vaccination declined  Tetanus, diphtheria, and acellular pertussis (Tdap) vaccination declined  Herpes zoster vaccination declined  Encounter for HCV screening test for low risk patient  Encounter for screening for HIV  Essential hypertension BP goal - < 140/90 Explained that having normal blood pressure is the goal and medications are helping to get to goal and maintain normal blood pressure. DIET: Limit salt intake, read nutrition labels to check salt content, limit fried and high fatty foods  Avoid using multisymptom OTC cold preparations that generally contain sudafed which can rise BP. Consult with pharmacist on best cold relief products to use for  persons with HTN EXERCISE Discussed incorporating exercise such as walking - 30 minutes most days of the week and can do in 10 minute intervals    Lisinopril  5 mg-which wakes her up to have to urinate.  Which disturbs her sleep.  Explained to taking in a.m. she does and this still continues.  Class 1 obesity due to excess calories without serious comorbidity with body mass index (BMI) of 30.0 to 30.9  in adult Obesity is 30-39 indicating an excess in caloric intake or underlining conditions. This may lead to other co-morbidities. Educated on lifestyle modifications of diet and exercise which may reduce obesity.   -     Lipid panel  Menstrual periods, abnormal With hot flashes was on hormones replacement and GYN stopped  -     CBC with Differential/Platelet   Pain of left eye  Stye information placed on AVS and how to use J&J baby shampoo   Follow-up:  Return in about 3 months (around 10/13/2023) for medical conditions.  The above assessment and management plan was discussed with the patient. The patient verbalized understanding of and has agreed to the management plan. Patient is aware to call the clinic if symptoms fail to improve or worsen. Patient is aware when to return to the clinic for a follow-up visit. Patient educated on when it is appropriate to go to the emergency department.   Rosaline Bohr, NP-C

## 2023-07-16 NOTE — Patient Instructions (Addendum)
 Stye A stye, also known as a hordeolum, is a bump that forms on an eyelid. It may look like a pimple next to the eyelash. A stye can form inside the eyelid (internal stye) or outside the eyelid (external stye). A stye can cause redness, swelling, and pain on the eyelid. Styes are very common. Anyone can get them at any age. They usually occur in just one eye at a time, but you may have more than one in either eye. What are the causes? A stye is caused by an infection. The infection is almost always caused by bacteria called Staphylococcus aureus. This is a common type of bacteria that lives on the skin. An internal stye may result from an infected oil-producing gland inside the eyelid. An external stye may be caused by an infection at the base of the eyelash (hair follicle). What increases the risk? You are more likely to develop a stye if: You have had a stye before. You have any of these conditions: Red, itchy, inflamed eyelids (blepharitis). A skin condition such as seborrheic dermatitis or rosacea. High fat levels in your blood (lipids). Dry eyes. What are the signs or symptoms? The most common symptom of a stye is eyelid pain. Internal styes are more painful than external styes. Other symptoms may include: Painful swelling of your eyelid. A scratchy feeling in your eye. Tearing and redness of your eye. A pimple-like bump on the edge of the eyelid. Pus draining from the stye. How is this diagnosed? Your health care provider may be able to diagnose a stye just by examining your eye. The health care provider may also check to make sure: You do not have a fever or other signs of a more serious infection. The infection has not spread to other parts of your eye or areas around your eye. How is this treated? Most styes will clear up in a few days without treatment or with warm compresses applied to the area. You may need to use antibiotic drops or ointment to treat an infection. Sometimes,  steroid drops or ointment are used in addition to antibiotics. In some cases, your health care provider may give you a small steroid injection in the eyelid. If your stye does not heal with routine treatment, your health care provider may drain pus from the stye using a thin blade or needle. This may be done if the stye is large, causing a lot of pain, or affecting your vision. Follow these instructions at home: Take over-the-counter and prescription medicines only as told by your health care provider. This includes eye drops or ointments. If you were prescribed an antibiotic medicine, steroid medicine, or both, apply or use them as told by your health care provider. Do not stop using the medicine even if your condition improves. Apply a warm, wet cloth (warm compress) to your eye for 5-10 minutes, 4 to 6 times a day. Clean the affected eyelid as directed by your health care provider. Do not wear contact lenses or eye makeup until your stye has healed and your health care provider says that it is safe. Do not try to pop or drain the stye. Do not rub your eye. Contact a health care provider if: You have chills or a fever. Your stye does not go away after several days. Your stye affects your vision. Your eyeball becomes swollen, red, or painful. Get help right away if: You have pain when moving your eye around. Summary A stye is a bump that forms  on an eyelid. It may look like a pimple next to the eyelash. A stye can form inside the eyelid (internal stye) or outside the eyelid (external stye). A stye can cause redness, swelling, and pain on the eyelid. Your health care provider may be able to diagnose a stye just by examining your eye. Apply a warm, wet cloth (warm compress) to your eye for 5-10 minutes, 4 to 6 times a day. This information is not intended to replace advice given to you by your health care provider. Make sure you discuss any questions you have with your health care  provider. Document Revised: 08/01/2020 Document Reviewed: 08/01/2020 Elsevier Patient Education  2024 [employer]Obesity, Adult Obesity is having too much body fat. Being obese means that your weight is more than what is healthy for you.  BMI (body mass index) is a number that explains how much body fat you have. If you have a BMI of 30 or more, you are obese. Obesity can cause serious health problems, such as: Stroke. Coronary artery disease (CAD). Type 2 diabetes. Some types of cancer. High blood pressure (hypertension). High cholesterol. Gallbladder stones. Obesity can also contribute to: Osteoarthritis. Sleep apnea. Infertility problems. What are the causes? Eating meals each day that are high in calories, sugar, and fat. Drinking a lot of drinks that have sugar in them. Being born with genes that may make you more likely to become obese. Having a medical condition that causes obesity. Taking certain medicines. Sitting a lot (having a sedentary lifestyle). Not getting enough sleep. What increases the risk? Having a family history of obesity. Living in an area with limited access to: [place], recreation centers, or sidewalks. Healthy food choices, such as grocery stores and farmers' markets. What are the signs or symptoms? The main sign is having too much body fat. How is this treated? Treatment for this condition often includes changing your lifestyle. Treatment may include: Changing your diet. This may include making a healthy meal plan. Exercise. This may include activity that causes your heart to beat faster (aerobic exercise) and strength training. Work with your doctor to design a program that works for you. Medicine to help you lose weight. This may be used if you are not able to lose one pound a week after 6 weeks of healthy eating and more exercise. Treating conditions that cause the obesity. Surgery. Options may include gastric banding and gastric bypass. This may  be done if: Other treatments have not helped to improve your condition. You have a BMI of 40 or higher. You have life-threatening health problems related to obesity. Follow these instructions at home: Eating and drinking  Follow advice from your doctor about what to eat and drink. Your doctor may tell you to: Limit fast food, sweets, and processed snack foods. Choose low-fat options. For example, choose low-fat milk instead of whole milk. Eat five or more servings of fruits or vegetables each day. Eat at home more often. This gives you more control over what you eat. Choose healthy foods when you eat out. Learn to read food labels. This will help you learn how much food is in one serving. Keep low-fat snacks available. Avoid drinks that have a lot of sugar in them. These include soda, fruit juice, iced tea with sugar, and flavored milk. Drink enough water  to keep your pee (urine) pale yellow. Do not go on fad diets. Physical activity Exercise often, as told by your doctor. Most adults should get up to 150 minutes of  moderate-intensity exercise every week.Ask your doctor: What types of exercise are safe for you. How often you should exercise. Warm up and stretch before being active. Do slow stretching after being active (cool down). Rest between times of being active. Lifestyle Work with your doctor and a food expert (dietitian) to set a weight-loss goal that is best for you. Limit your screen time. Find ways to reward yourself that do not involve food. Do not drink alcohol if: Your doctor tells you not to drink. You are pregnant, may be pregnant, or are planning to become pregnant. If you drink alcohol: Limit how much you have to: 0-1 drink a day for women. 0-2 drinks a day for men. Know how much alcohol is in your drink. In the [place] one drink equals one 12 oz bottle of beer (355 mL), one 5 oz glass of wine (148 mL), or one 1 oz glass of hard liquor (44 mL). General  instructions Keep a weight-loss journal. This can help you keep track of: The food that you eat. How much exercise you get. Take over-the-counter and prescription medicines only as told by your doctor. Take vitamins and supplements only as told by your doctor. Think about joining a support group. Pay attention to your mental health as obesity can lead to depression or self esteem issues. Keep all follow-up visits. Contact a doctor if: You cannot meet your weight-loss goal after you have changed your diet and lifestyle for 6 weeks. You are having trouble breathing. Summary Obesity is having too much body fat. Being obese means that your weight is more than what is healthy for you. Work with your doctor to set a weight-loss goal. Get regular exercise as told by your doctor. This information is not intended to replace advice given to you by your health care provider. Make sure you discuss any questions you have with your health care provider. Document Revised: 01/01/2021 Document Reviewed: 01/01/2021 Elsevier Patient Education  2024 [employer].

## 2023-07-17 LAB — CBC WITH DIFFERENTIAL/PLATELET
Basophils Absolute: 0.1 10*3/uL (ref 0.0–0.2)
Basos: 2 %
EOS (ABSOLUTE): 0.1 10*3/uL (ref 0.0–0.4)
Eos: 3 %
Hematocrit: 39.4 % (ref 34.0–46.6)
Hemoglobin: 12.4 g/dL (ref 11.1–15.9)
Immature Grans (Abs): 0 10*3/uL (ref 0.0–0.1)
Immature Granulocytes: 0 %
Lymphocytes Absolute: 1.2 10*3/uL (ref 0.7–3.1)
Lymphs: 30 %
MCH: 25.9 pg — ABNORMAL LOW (ref 26.6–33.0)
MCHC: 31.5 g/dL (ref 31.5–35.7)
MCV: 82 fL (ref 79–97)
Monocytes Absolute: 0.5 10*3/uL (ref 0.1–0.9)
Monocytes: 13 %
Neutrophils Absolute: 2.1 10*3/uL (ref 1.4–7.0)
Neutrophils: 52 %
Platelets: 249 10*3/uL (ref 150–450)
RBC: 4.78 x10E6/uL (ref 3.77–5.28)
RDW: 15.1 % (ref 11.7–15.4)
WBC: 4.1 10*3/uL (ref 3.4–10.8)

## 2023-07-17 LAB — CMP14+EGFR
ALT: 25 [IU]/L (ref 0–32)
AST: 22 [IU]/L (ref 0–40)
Albumin: 4.2 g/dL (ref 3.8–4.9)
Alkaline Phosphatase: 125 [IU]/L — ABNORMAL HIGH (ref 44–121)
BUN/Creatinine Ratio: 18 (ref 12–28)
BUN: 14 mg/dL (ref 8–27)
Bilirubin Total: 0.4 mg/dL (ref 0.0–1.2)
CO2: 23 mmol/L (ref 20–29)
Calcium: 9.4 mg/dL (ref 8.7–10.3)
Chloride: 105 mmol/L (ref 96–106)
Creatinine, Ser: 0.76 mg/dL (ref 0.57–1.00)
Globulin, Total: 3.1 g/dL (ref 1.5–4.5)
Glucose: 84 mg/dL (ref 70–99)
Potassium: 4 mmol/L (ref 3.5–5.2)
Sodium: 140 mmol/L (ref 134–144)
Total Protein: 7.3 g/dL (ref 6.0–8.5)
eGFR: 90 mL/min/{1.73_m2} (ref >59)

## 2023-07-17 LAB — HCV AB W REFLEX TO QUANT PCR: HCV Ab: NONREACTIVE

## 2023-07-17 LAB — LIPID PANEL
Chol/HDL Ratio: 3.7 {ratio} (ref 0.0–4.4)
Cholesterol, Total: 156 mg/dL (ref 100–199)
HDL: 42 mg/dL (ref >39)
LDL Chol Calc (NIH): 83 mg/dL (ref 0–99)
Triglycerides: 180 mg/dL — ABNORMAL HIGH (ref 0–149)
VLDL Cholesterol Cal: 31 mg/dL (ref 5–40)

## 2023-07-17 LAB — HIV ANTIBODY (ROUTINE TESTING W REFLEX): HIV Screen 4th Generation wRfx: NONREACTIVE

## 2023-07-17 LAB — HCV INTERPRETATION

## 2023-07-24 ENCOUNTER — Other Ambulatory Visit (INDEPENDENT_AMBULATORY_CARE_PROVIDER_SITE_OTHER): Payer: Self-pay

## 2023-07-24 MED ORDER — GEMFIBROZIL 600 MG PO TABS: 600.0000 mg | ORAL_TABLET | Freq: Two times a day (BID) | ORAL | 1 refills | Status: AC

## 2024-01-13 ENCOUNTER — Telehealth (INDEPENDENT_AMBULATORY_CARE_PROVIDER_SITE_OTHER): Payer: Self-pay | Admitting: Primary Care

## 2024-01-13 ENCOUNTER — Ambulatory Visit (INDEPENDENT_AMBULATORY_CARE_PROVIDER_SITE_OTHER): Payer: BC Managed Care – PPO | Admitting: Primary Care

## 2024-01-13 NOTE — Telephone Encounter (Signed)
 Called pt to reschedule appt. Provider will not be in office. Pt did not answer and LVM.

## 2024-01-17 ENCOUNTER — Other Ambulatory Visit (INDEPENDENT_AMBULATORY_CARE_PROVIDER_SITE_OTHER): Payer: Self-pay | Admitting: Primary Care

## 2024-01-20 NOTE — Telephone Encounter (Signed)
 Courtesy refill. Requested Prescriptions  Pending Prescriptions Disp Refills   lisinopril  (ZESTRIL ) 5 MG tablet [Pharmacy Med Name: Lisinopril  5 MG Oral Tablet] 30 tablet 0    Sig: Take 1 tablet by mouth once daily     Cardiovascular:  ACE Inhibitors Failed - 01/20/2024  1:39 PM      Failed - Cr in normal range and within 180 days    Creatinine, Ser  Date Value Ref Range Status  07/16/2023 0.76 0.57 - 1.00 mg/dL Final         Failed - K in normal range and within 180 days    Potassium  Date Value Ref Range Status  07/16/2023 4.0 3.5 - 5.2 mmol/L Final         Failed - Last BP in normal range    BP Readings from Last 1 Encounters:  07/16/23 (!) 142/84         Failed - Valid encounter within last 6 months    Recent Outpatient Visits           6 months ago Encounter to establish care   [place] [facility] Celestia Rosaline SQUIBB, NP              Passed - Patient is not pregnant

## 2024-02-29 ENCOUNTER — Other Ambulatory Visit (INDEPENDENT_AMBULATORY_CARE_PROVIDER_SITE_OTHER): Payer: Self-pay | Admitting: Primary Care

## 2024-02-29 NOTE — Telephone Encounter (Unsigned)
 Copied from CRM #[id number]. Topic: Clinical - Medication Refill >> Feb 29, 2024  4:11 PM Everette C wrote: Medication: lisinopril  (ZESTRIL ) 5 MG tablet   Has the patient contacted their pharmacy? Yes (Agent: If no, request that the patient contact the pharmacy for the refill. If patient does not wish to contact the pharmacy document the reason why and proceed with request.) (Agent: If yes, when and what did the pharmacy advise?)  This is the patient's preferred pharmacy:  [id number] - [place] ([place]), [place] - [address] [place] ([place]) [place] [address] Phone: [phone] Fax: [phone]  Is this the correct pharmacy for this prescription? Yes If no, delete pharmacy and type the correct one.   Has the prescription been filled recently? Yes  Is the patient out of the medication? Yes  Has the patient been seen for an appointment in the last year OR does the patient have an upcoming appointment? Yes  Can we respond through MyChart? No  Agent: Please be advised that Rx refills may take up to 3 business days. We ask that you follow-up with your pharmacy.

## 2024-03-01 NOTE — Telephone Encounter (Signed)
 Requested medications are due for refill today.  yes  Requested medications are on the active medications list.  yes  Last refill. 01/20/2024 #30 0 rf  Future visit scheduled.   no  Notes to clinic.  Pt is overdue for OV. Pt already given a courtesy refill.    Requested Prescriptions  Pending Prescriptions Disp Refills   lisinopril  (ZESTRIL ) 5 MG tablet 30 tablet 0    Sig: Take 1 tablet (5 mg total) by mouth daily.     Cardiovascular:  ACE Inhibitors Failed - 03/01/2024  2:55 PM      Failed - Cr in normal range and within 180 days    Creatinine, Ser  Date Value Ref Range Status  07/16/2023 0.76 0.57 - 1.00 mg/dL Final         Failed - K in normal range and within 180 days    Potassium  Date Value Ref Range Status  07/16/2023 4.0 3.5 - 5.2 mmol/L Final         Failed - Last BP in normal range    BP Readings from Last 1 Encounters:  07/16/23 (!) 142/84         Failed - Valid encounter within last 6 months    Recent Outpatient Visits           7 months ago Encounter to establish care   [place] [facility] Celestia Rosaline SQUIBB, NP              Passed - Patient is not pregnant

## 2024-03-04 ENCOUNTER — Other Ambulatory Visit (INDEPENDENT_AMBULATORY_CARE_PROVIDER_SITE_OTHER): Payer: Self-pay | Admitting: Primary Care

## 2024-03-04 NOTE — Telephone Encounter (Unsigned)
 Copied from CRM [id number]. Topic: Clinical - Medication Refill >> Mar 04, 2024 12:16 PM [name] wrote: Medication: lisinopril  (ZESTRIL ) 5 MG tablet  Has the patient contacted their pharmacy? Yes (Agent: If no, request that the patient contact the pharmacy for the refill. If patient does not wish to contact the pharmacy document the reason why and proceed with request.) (Agent: If yes, when and what did the pharmacy advise?)  This is the patient's preferred pharmacy:  [facility] - [place] ([place]), [place] - [address] [place] ([place]) [place] [address] Phone: [phone] Fax: [phone]  Is this the correct pharmacy for this prescription? Yes If no, delete pharmacy and type the correct one.   Has the prescription been filled recently? Yes  Is the patient out of the medication? Yes  Has the patient been seen for an appointment in the last year OR does the patient have an upcoming appointment? Yes  Can we respond through MyChart? Yes  Agent: Please be advised that Rx refills may take up to 3 business days. We ask that you follow-up with your pharmacy.

## 2024-03-06 ENCOUNTER — Other Ambulatory Visit (INDEPENDENT_AMBULATORY_CARE_PROVIDER_SITE_OTHER): Payer: Self-pay | Admitting: Primary Care

## 2024-03-06 MED ORDER — LISINOPRIL 5 MG PO TABS: 5.0000 mg | ORAL_TABLET | Freq: Every day | ORAL | 1 refills | Status: DC

## 2024-03-21 ENCOUNTER — Ambulatory Visit: Payer: Self-pay | Admitting: Nurse Practitioner

## 2024-04-05 ENCOUNTER — Encounter (INDEPENDENT_AMBULATORY_CARE_PROVIDER_SITE_OTHER): Payer: Self-pay | Admitting: Primary Care

## 2024-04-05 ENCOUNTER — Ambulatory Visit (INDEPENDENT_AMBULATORY_CARE_PROVIDER_SITE_OTHER): Admitting: Primary Care

## 2024-04-05 VITALS — BP 121/64 | HR 72 | Temp 98.2°F | Resp 16 | Ht 65.0 in | Wt 187.0 lb

## 2024-04-05 DIAGNOSIS — Z13220 Encounter for screening for lipoid disorders: Secondary | ICD-10-CM

## 2024-04-05 DIAGNOSIS — I10 Essential (primary) hypertension: Secondary | ICD-10-CM

## 2024-04-18 ENCOUNTER — Encounter (INDEPENDENT_AMBULATORY_CARE_PROVIDER_SITE_OTHER): Payer: Self-pay | Admitting: Primary Care

## 2024-04-18 NOTE — Progress Notes (Signed)
 [facility]  Joyce Brooks, is a 63 y.o. female  RDW:[id number]  FMW:[id number]  DOB - [birth year]  Chief Complaint  Patient presents with   Hypertension       Subjective:   Joyce Brooks is a 63 y.o. female here today for a follow up visit.  Hypertension patient has No headache, No chest pain, No abdominal pain - No Nausea, No new weakness tingling or numbness, No Cough - shortness of breath Hypertension    No problems updated.  Comprehensive ROS Pertinent positive and negative noted in HPI   No Known Allergies  Past Medical History:  Diagnosis Date   Anemia    Hypertension    Thyroid  disease    PT denies hx of thyroid  disease    Current Outpatient Medications on File Prior to Visit  Medication Sig Dispense Refill   lisinopril  (ZESTRIL ) 5 MG tablet Take 1 tablet (5 mg total) by mouth daily. 30 tablet 1   gemfibrozil  (LOPID ) 600 MG tablet Take 1 tablet (600 mg total) by mouth 2 (two) times daily before a meal. (Patient not taking: Reported on 04/05/2024) 180 tablet 1   No current facility-administered medications on file prior to visit.   Health Maintenance  Topic Date Due   Pap with HPV screening  Never done   Pneumococcal Vaccine for age over 76 (1 of 1 - PCV) Never done   Zoster (Shingles) Vaccine (1 of 2) Never done   COVID-19 Vaccine (1 - 2025-26 season) Never done   DTaP/Tdap/Td vaccine (1 - Tdap) 07/15/2024*   Flu Shot  09/06/2024*   Breast Cancer Screening  01/05/2025   Cologuard (Stool DNA test)  05/01/2026   Hepatitis C Screening  Completed   HIV Screening  Completed   Hepatitis B Vaccine  Aged Out   HPV Vaccine  Aged Out   Meningitis B Vaccine  Aged Out  *Topic was postponed. The date shown is not the original due date.    Objective:   Vitals:   04/05/24 1549  BP: 121/64  Pulse: 72  Resp: 16  Temp: 98.2 F (36.8 C)  TempSrc: Oral  SpO2: 98%  Weight: 187 lb (84.8 kg)  Height: 5' 5 (1.651 m)   BP Readings from Last 3  Encounters:  04/05/24 121/64  07/16/23 (!) 142/84  05/24/21 127/78      Physical Exam Vitals reviewed.  Constitutional:      Appearance: Normal appearance. She is obese.  HENT:     Head: Normocephalic.     Right Ear: Tympanic membrane, ear canal and external ear normal.     Left Ear: Tympanic membrane, ear canal and external ear normal.     Nose: Nose normal.     Mouth/Throat:     Mouth: Mucous membranes are moist.  Eyes:     Extraocular Movements: Extraocular movements intact.     Pupils: Pupils are equal, round, and reactive to light.  Cardiovascular:     Rate and Rhythm: Normal rate.  Pulmonary:     Effort: Pulmonary effort is normal.     Breath sounds: Normal breath sounds.  Abdominal:     General: Bowel sounds are normal.     Palpations: Abdomen is soft.  Musculoskeletal:        General: Normal range of motion.     Cervical back: Normal range of motion.  Skin:    General: Skin is warm and dry.  Neurological:     Mental Status: She is alert and oriented  to person, place, and time.  Psychiatric:        Mood and Affect: Mood normal.        Behavior: Behavior normal.        Thought Content: Thought content normal.    Assessment & Plan  Joyce Brooks was seen today for hypertension.  Diagnoses and all orders for this visit:  Essential hypertension Well controlled  BP goal - < 130/80 DIET: Limit salt intake, read nutrition labels to check salt content, limit fried and high fatty foods  Avoid using multisymptom OTC cold preparations that generally contain sudafed which can rise BP. Consult with pharmacist on best cold relief products to use for persons with HTN EXERCISE Discussed incorporating exercise such as walking - 30 minutes most days of the week and can do in 10 minute intervals     Patient have been counseled extensively about nutrition and exercise. Other issues discussed during this visit include: low cholesterol diet, weight control and daily exercise, foot  care, annual eye examinations at Ophthalmology, importance of adherence with medications and regular follow-up. We also discussed long term complications of uncontrolled diabetes and hypertension.    The patient was given clear instructions to go to ER or return to medical center if symptoms don't improve, worsen or new problems develop. The patient verbalized understanding. The patient was told to call to get lab results if they haven't heard anything in the next week.   This note has been created with Education officer, environmental. Any transcriptional errors are unintentional.   Joyce SHAUNNA Bohr, NP 04/18/2024, 5:09 PM

## 2024-04-26 ENCOUNTER — Telehealth (INDEPENDENT_AMBULATORY_CARE_PROVIDER_SITE_OTHER): Payer: Self-pay | Admitting: Primary Care

## 2024-04-26 ENCOUNTER — Other Ambulatory Visit (INDEPENDENT_AMBULATORY_CARE_PROVIDER_SITE_OTHER): Payer: Self-pay

## 2024-04-26 MED ORDER — LISINOPRIL 5 MG PO TABS: 5.0000 mg | ORAL_TABLET | Freq: Every day | ORAL | 1 refills | Status: AC

## 2024-04-26 NOTE — Telephone Encounter (Signed)
 Rx has been sent

## 2024-04-26 NOTE — Telephone Encounter (Signed)
 Pt requesting refills on lisinopril  (ZESTRIL ) 5 MG tablet [[id number]]

## 2024-04-29 ENCOUNTER — Telehealth (INDEPENDENT_AMBULATORY_CARE_PROVIDER_SITE_OTHER): Payer: Self-pay | Admitting: Primary Care

## 2024-04-29 NOTE — Telephone Encounter (Signed)
 Pt came in stating that she needed her medication refilled Lisinopril  5MG . Pt stated that she came in on 04/26/24 asking provider to refill medication. Pt stated that she spoke to an young lady about refill and pt was instructed to go to pharmacy and pick up. Med is still not ready and from her understanding medication has not been sent yet.

## 2024-04-29 NOTE — Telephone Encounter (Signed)
 Informed pt that rx was sent 04/26/24. Pt will go by pharmacy
